# Patient Record
Sex: Male | Born: 1945 | Race: White | Hispanic: No | Marital: Married | Smoking: Never smoker
Health system: Southern US, Community
[De-identification: ages and names within clinical notes are randomized; demographics above are authoritative.]

## PROBLEM LIST (undated history)

## (undated) ENCOUNTER — Emergency Department (HOSPITAL_COMMUNITY): Admission: EM | Payer: Medicare Other | Source: Home / Self Care

## (undated) DIAGNOSIS — I10 Essential (primary) hypertension: Secondary | ICD-10-CM

## (undated) DIAGNOSIS — Z87442 Personal history of urinary calculi: Secondary | ICD-10-CM

## (undated) DIAGNOSIS — G4730 Sleep apnea, unspecified: Secondary | ICD-10-CM

## (undated) DIAGNOSIS — K219 Gastro-esophageal reflux disease without esophagitis: Secondary | ICD-10-CM

## (undated) HISTORY — PX: EYE SURGERY: SHX253

## (undated) HISTORY — PX: CHOLECYSTECTOMY: SHX55

## (undated) HISTORY — PX: TONSILLECTOMY: SUR1361

## (undated) HISTORY — PX: APPENDECTOMY: SHX54

---

## 2020-01-03 ENCOUNTER — Ambulatory Visit
Admission: RE | Admit: 2020-01-03 | Discharge: 2020-01-03 | Disposition: A | Discharge status: Home / Self Care | Payer: Medicare Other | Source: Ambulatory Visit | Attending: Physician Assistant | Admitting: Physician Assistant

## 2020-01-03 ENCOUNTER — Other Ambulatory Visit: Payer: Self-pay | Admitting: Physician Assistant

## 2020-01-03 ENCOUNTER — Other Ambulatory Visit: Payer: Self-pay

## 2020-01-03 DIAGNOSIS — R634 Abnormal weight loss: Secondary | ICD-10-CM

## 2020-01-03 DIAGNOSIS — R112 Nausea with vomiting, unspecified: Secondary | ICD-10-CM

## 2020-01-03 DIAGNOSIS — R6881 Early satiety: Secondary | ICD-10-CM

## 2020-01-03 IMAGING — CT CT ABD-PELV W/ CM
1 of 3 series · 13 of 32 positions shown, 19 images · IV contrast (APPLIED)
Comparison: None.

CLINICAL DATA: Loss of appetite, 41 pound weight loss, vomiting

EXAM:
CT ABDOMEN AND PELVIS WITH CONTRAST
TECHNIQUE: Multidetector CT imaging of the abdomen and pelvis was performed
using the standard protocol following bolus administration of
intravenous contrast.
CONTRAST:  100mL [IM] IOPAMIDOL ([IM]) INJECTION 61%

[Series 2: abd/pelvis w/cm · axial · 0.97mm/px · z∈[-429,-4]mm · 13 of 101 slices shown, 19 images]
[im 8/101  soft-tissue]
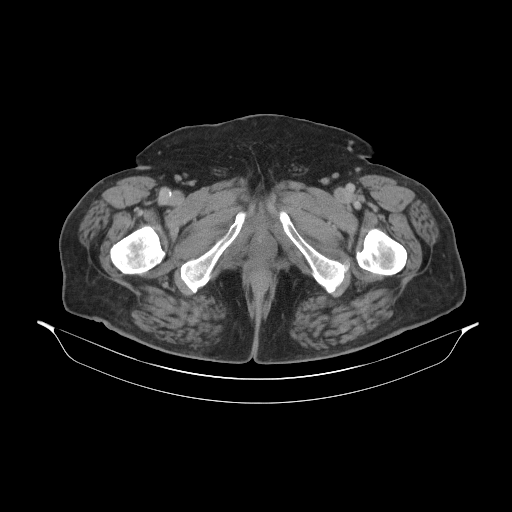
[im 8/101  bone]
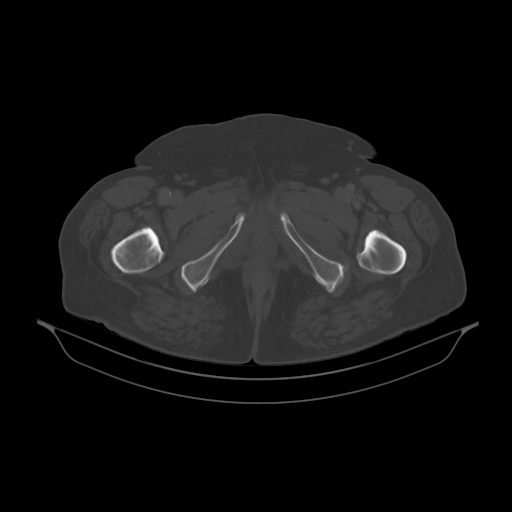
[im 15/101  soft-tissue]
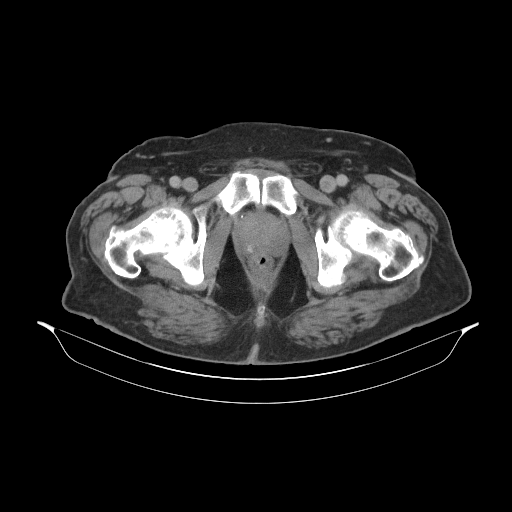
[im 22/101  soft-tissue]
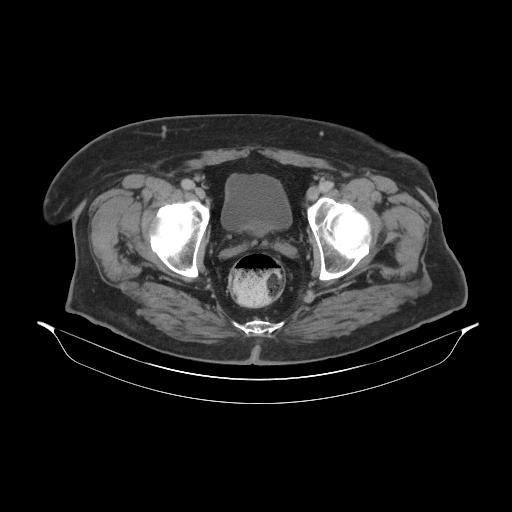
[im 29/101  soft-tissue]
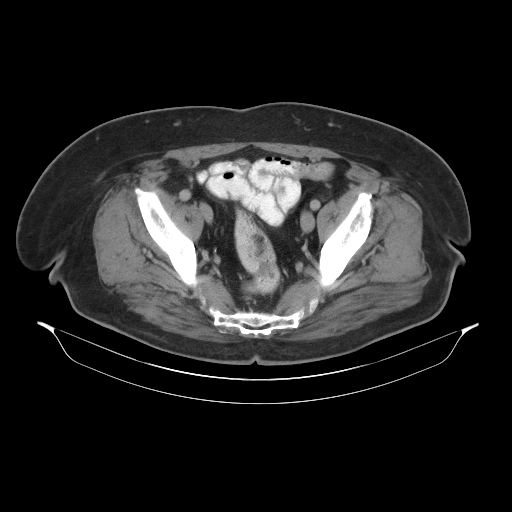
[im 36/101  soft-tissue]
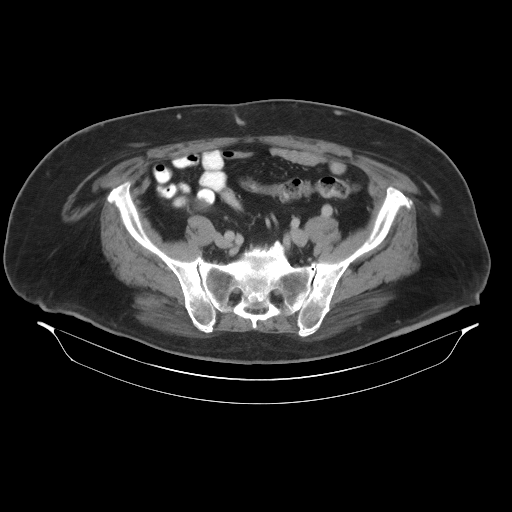
[im 43/101  soft-tissue]
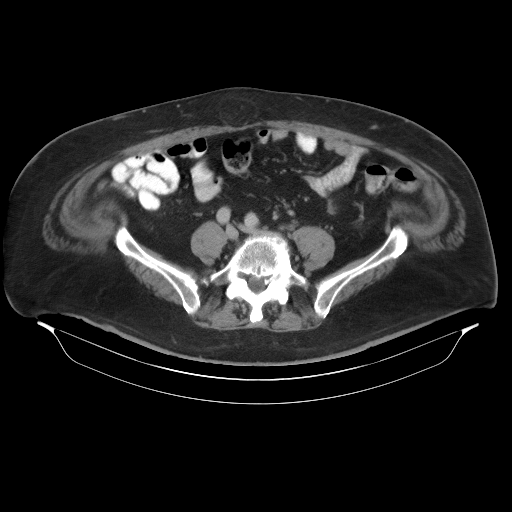
[im 51/101  soft-tissue]
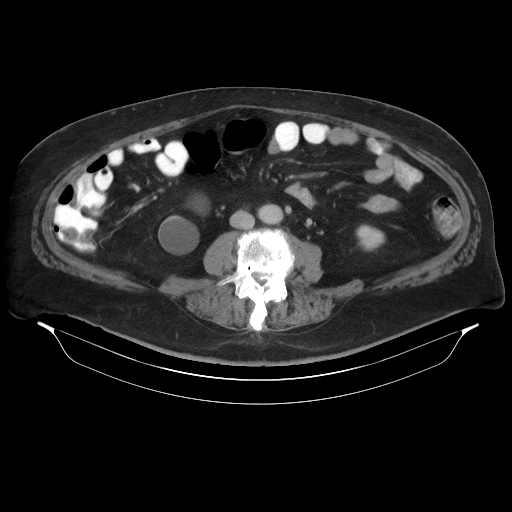
[im 58/101  soft-tissue]
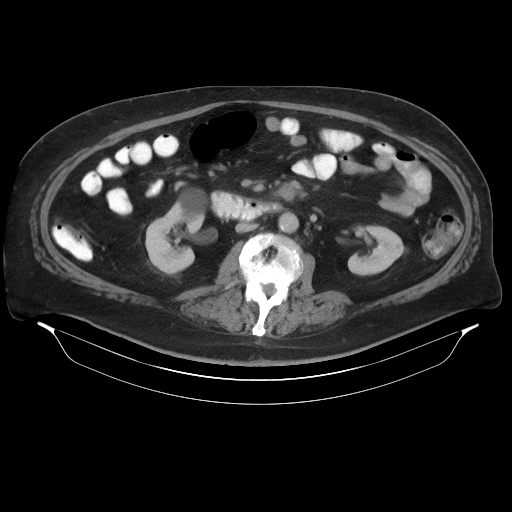
[im 65/101  soft-tissue]
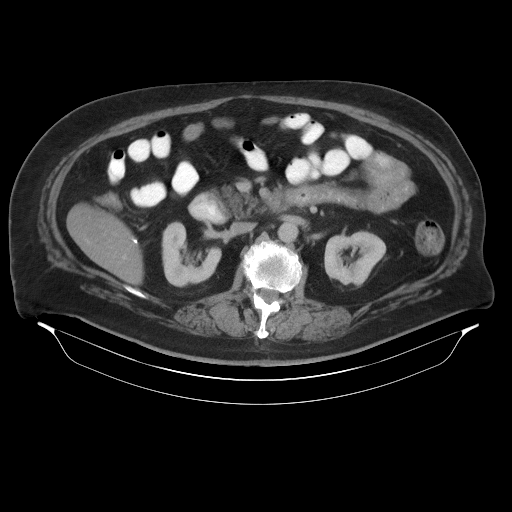
[im 65/101  bone]
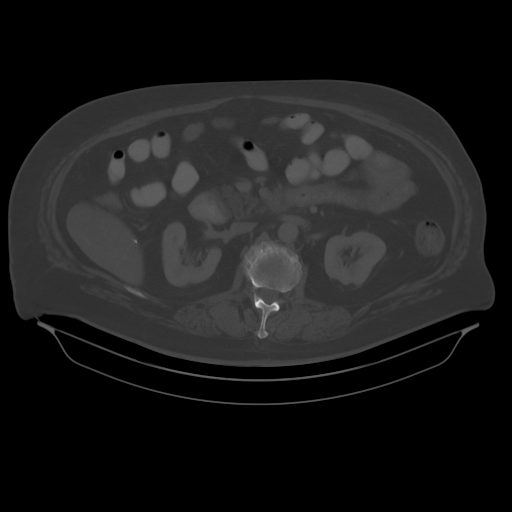
[im 72/101  soft-tissue]
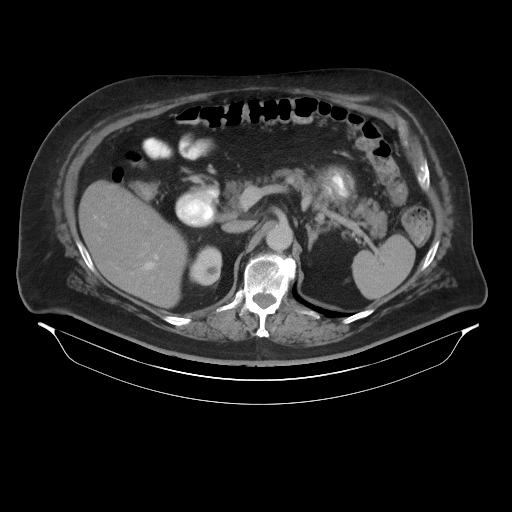
[im 72/101  lung]
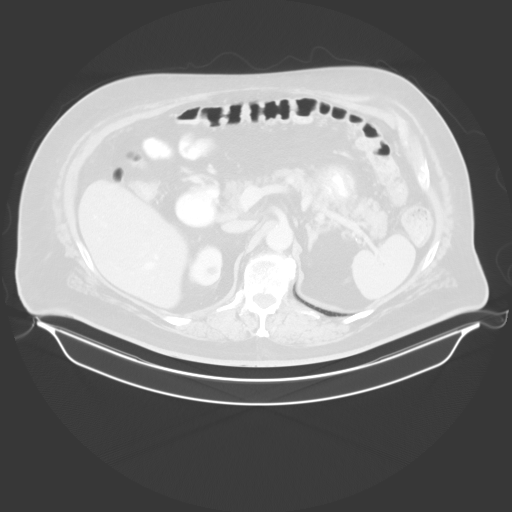
[im 79/101  soft-tissue]
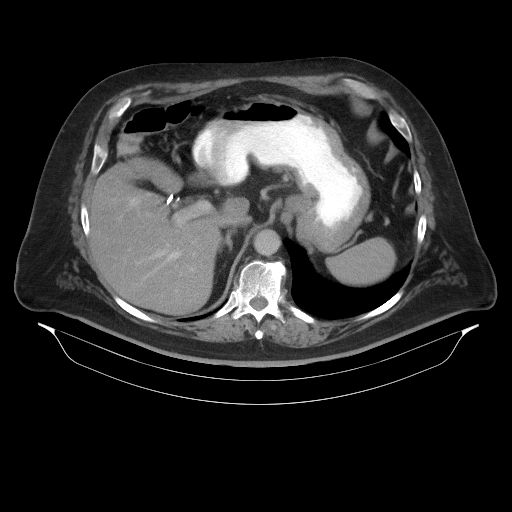
[im 79/101  lung]
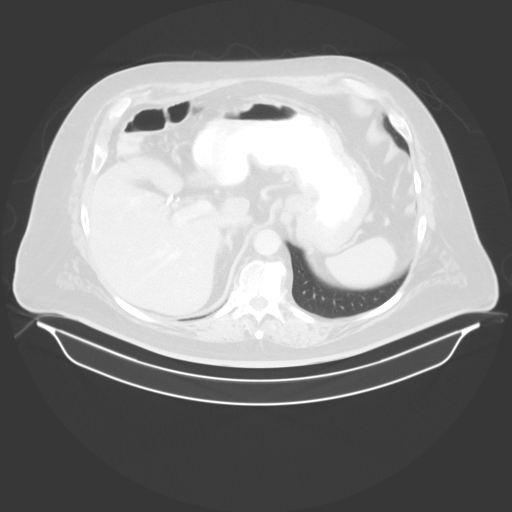
[im 86/101  soft-tissue]
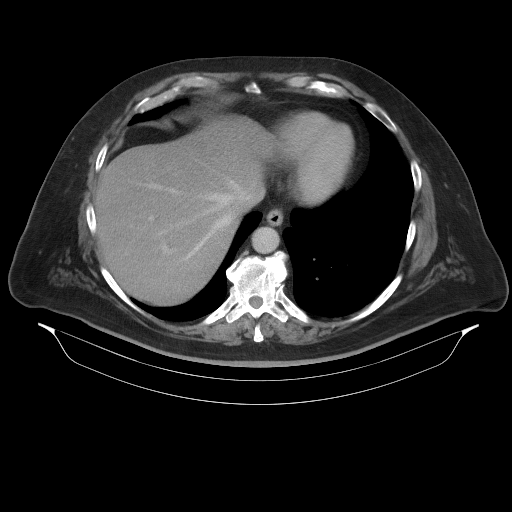
[im 86/101  lung]
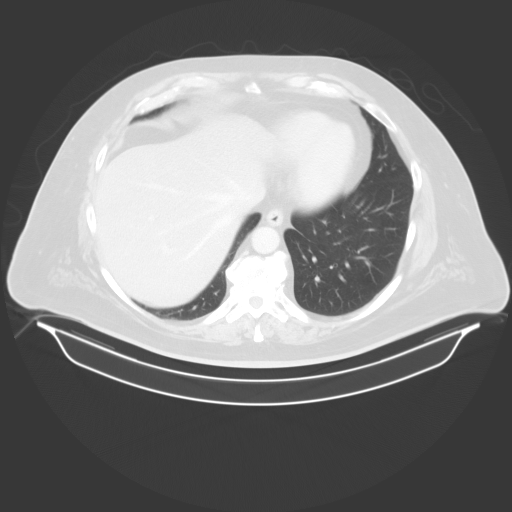
[im 93/101  soft-tissue]
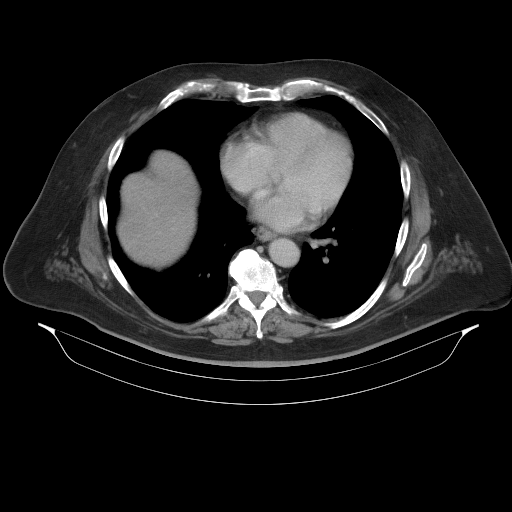
[im 93/101  lung]
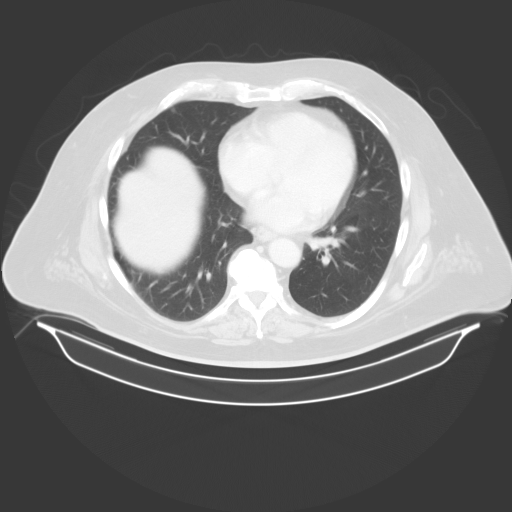

[13 of 32 positions shown; findings below may reference images not displayed]

FINDINGS: Lower chest: No acute pleural or parenchymal lung disease.

Hepatobiliary: Gallbladder is surgically absent. The liver is
grossly unremarkable.

Pancreas: Unremarkable. No pancreatic ductal dilatation or
surrounding inflammatory changes.

Spleen: Normal in size without focal abnormality.

Adrenals/Urinary Tract: There are bilateral renal cortical cysts. No
urinary tract calculi or obstructive uropathy. The bladder is
unremarkable. Adrenals are normal.

Stomach/Bowel: There is abnormal wall thickening of the distal
duodenum, with surrounding fat stranding and lymphadenopathy.
Underlying neoplasm is suspected given clinical history. Endoscopy
is recommended.

No bowel obstruction or ileus.  The appendix is not identified.

Vascular/Lymphatic: Enlarged lymph nodes are seen surrounding the
inflamed distal duodenum described above. Largest lymph node
measures 16 mm, reference image 45 of series 2.

Vascular structures enhance normally.

Reproductive: Prostate is enlarged measuring 5.9 x 4.7 cm.

Other: There is no free fluid or free gas. Small fat containing
supraumbilical hernia is noted.

Musculoskeletal: There are no acute or destructive bony lesions.
Reconstructed images demonstrate no additional findings.
IMPRESSION: 1. Wall thickening of the fourth portion duodenum just proximal to
the ligament of Treitz, with surrounding fat stranding and
pathologic mesenteric adenopathy. Given the clinical history,
neoplasm is the diagnosis of exclusion. Endoscopy is recommended for
further evaluation.
2. Enlarged prostate.
3. Fat containing supraumbilical ventral hernia.

## 2020-01-03 MED ORDER — IOPAMIDOL (ISOVUE-300) INJECTION 61%
100.0000 mL | Freq: Once | INTRAVENOUS | Status: AC | PRN
  Administered 2020-01-03: 100 mL via INTRAVENOUS

## 2020-01-04 ENCOUNTER — Other Ambulatory Visit (HOSPITAL_COMMUNITY): Payer: Self-pay | Admitting: Physician Assistant

## 2020-01-04 ENCOUNTER — Other Ambulatory Visit: Payer: Self-pay | Admitting: Physician Assistant

## 2020-01-04 DIAGNOSIS — R6881 Early satiety: Secondary | ICD-10-CM

## 2020-01-04 DIAGNOSIS — R634 Abnormal weight loss: Secondary | ICD-10-CM

## 2020-01-04 DIAGNOSIS — R1110 Vomiting, unspecified: Secondary | ICD-10-CM

## 2020-01-14 ENCOUNTER — Ambulatory Visit (HOSPITAL_COMMUNITY)
Admission: RE | Admit: 2020-01-14 | Discharge: 2020-01-14 | Disposition: A | Discharge status: Home / Self Care | Payer: Medicare Other | Source: Ambulatory Visit | Attending: Physician Assistant | Admitting: Physician Assistant

## 2020-01-14 ENCOUNTER — Other Ambulatory Visit: Payer: Self-pay

## 2020-01-14 DIAGNOSIS — R6881 Early satiety: Secondary | ICD-10-CM | POA: Diagnosis present

## 2020-01-14 DIAGNOSIS — R634 Abnormal weight loss: Secondary | ICD-10-CM | POA: Diagnosis present

## 2020-01-14 DIAGNOSIS — R1110 Vomiting, unspecified: Secondary | ICD-10-CM | POA: Diagnosis present

## 2020-01-14 LAB — POCT I-STAT CREATININE: Creatinine, Ser: 0.7 mg/dL (ref 0.61–1.24)

## 2020-01-14 IMAGING — MR MR ABDOMEN WO/W CM
15 of 17 series · 39 of 48 positions shown · IV contrast (gadavist)
Comparison: CT scan [DATE]

CLINICAL DATA: Weight loss, vomiting and abnormal recent CT scan.

EXAM:
MRI ABDOMEN WITHOUT AND WITH CONTRAST
TECHNIQUE: Multiplanar multisequence MR imaging of the abdomen was performed
both before and after the administration of intravenous contrast.
CONTRAST:  10mL GADAVIST GADOBUTROL 1 MMOL/ML IV SOLN

[Series 3: T2 · sagittal · 5.0mm · 1.03mm/px · 2 of 43 slices shown (1 of 5)]
[im 1/43]
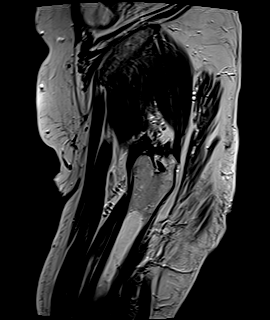
[im 43/43]
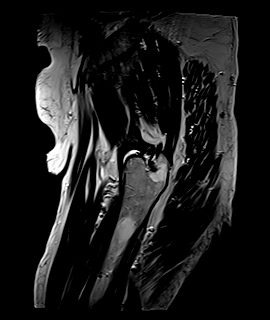

[Series 4: T2 · axial · 5.0mm · 0.53mm/px · z∈[-102,+126]mm · 2 of 39 slices shown (2 of 5)]
[im 1/39]
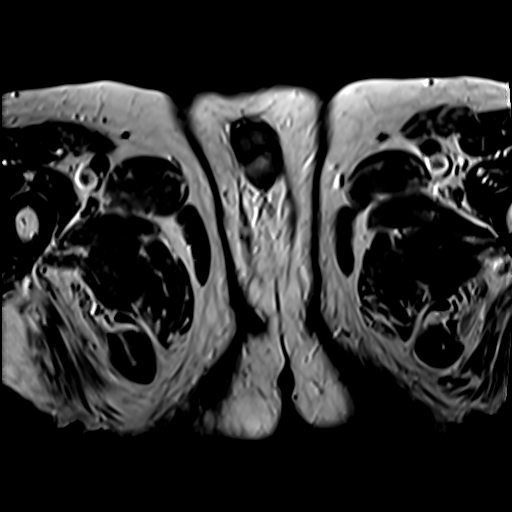
[im 39/39]
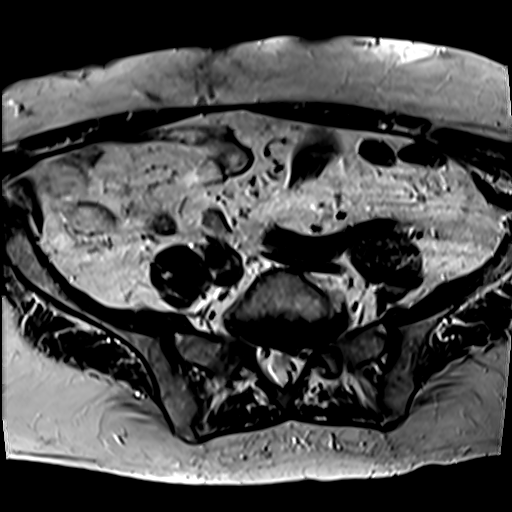

[Series 5: T1 · axial · 5.0mm · 0.53mm/px · z∈[-102,+126]mm · 2 of 39 slices shown (1 of 3)]
[im 1/39]
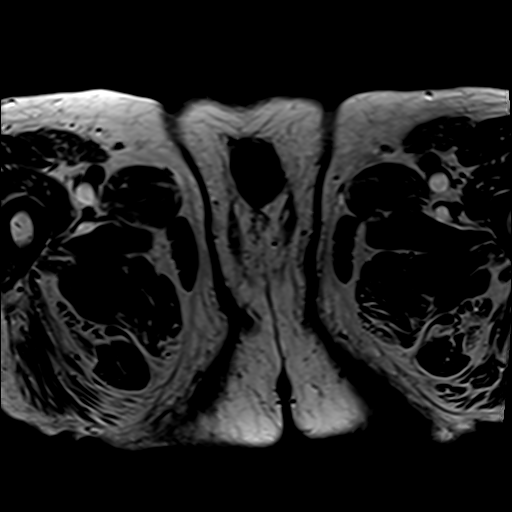
[im 39/39]
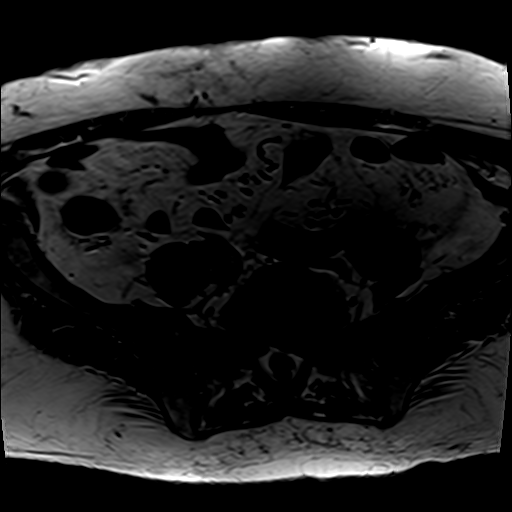

[Series 6: T1 fat-sat · axial · non-contrast · 5.0mm · 0.53mm/px · z∈[-102,+126]mm · 2 of 39 slices shown]
[im 1/39]
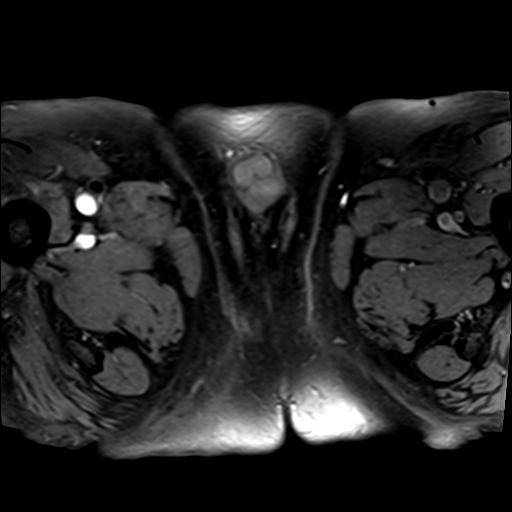
[im 39/39]
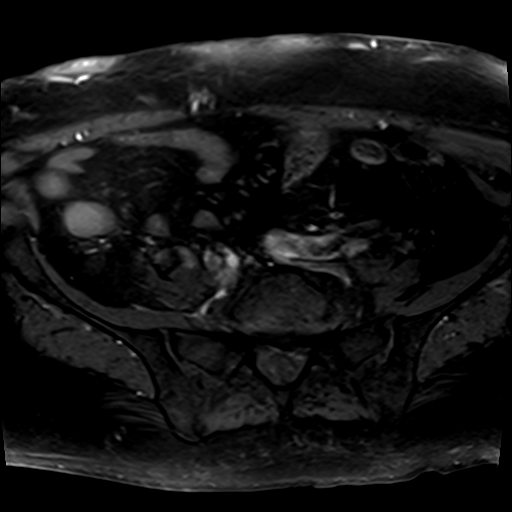

[Series 7: T2 · coronal · 5.0mm · 1.48mm/px · 2 of 32 slices shown (3 of 5)]
[im 1/32]
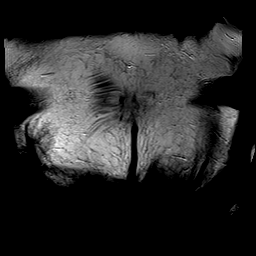
[im 32/32]
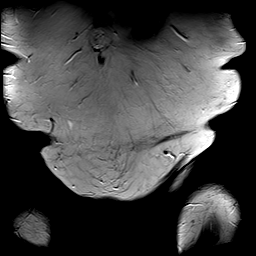

[Series 9: T2 · coronal · 6.0mm · 1.76mm/px · 2 of 36 slices shown (4 of 5)]
[im 1/36]
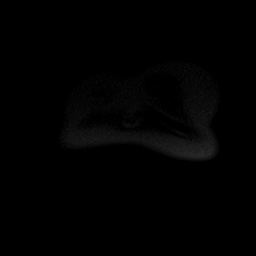
[im 36/36]
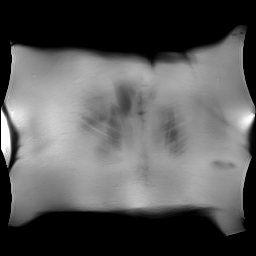

[Series 10: t2_trufi_tra_p2_bh_4mm · axial · 6.0mm · 0.88mm/px · z∈[+150,+444]mm · 2 of 50 slices shown]
[im 1/50]
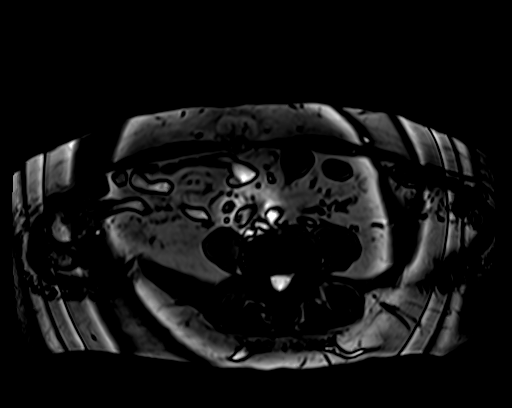
[im 50/50]
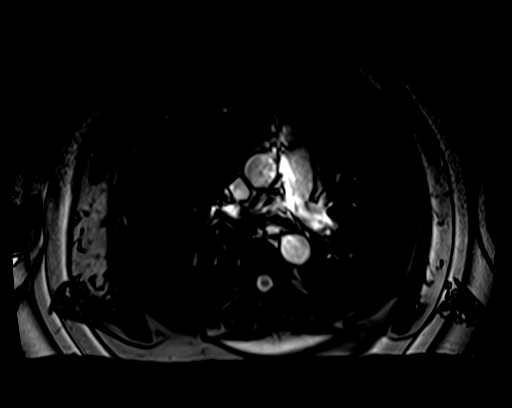

[Series 13: T1 · axial · 4.0mm · 1.34mm/px · z∈[+139,+455]mm · 4 of 80 slices shown (2 of 3)]
[im 1/80]
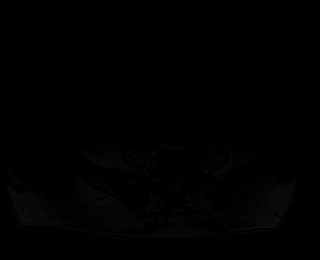
[im 27/80]
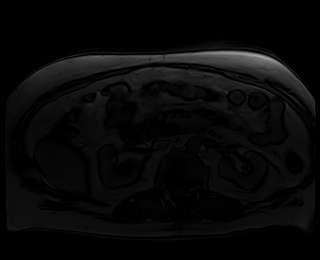
[im 53/80]
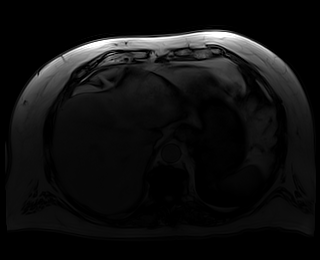
[im 80/80]
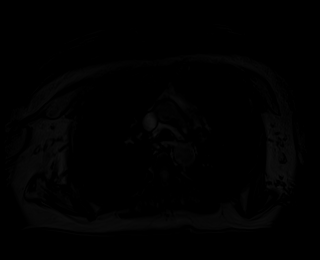

[Series 14: T1 · axial · 4.0mm · 1.34mm/px · z∈[+139,+455]mm · 4 of 80 slices shown (3 of 3)]
[im 1/80]
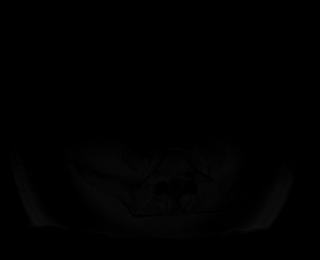
[im 27/80]
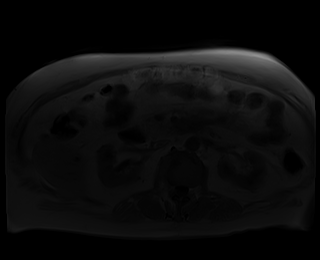
[im 53/80]
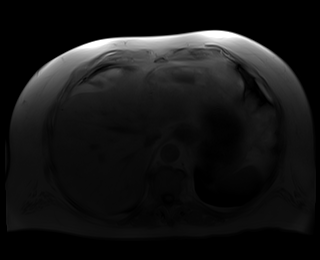
[im 80/80]
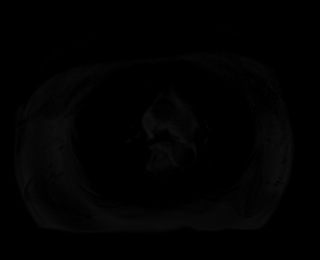

[Series 15: T2 fat-sat · axial · 6.0mm · 1.34mm/px · z∈[+143,+452]mm · 2 of 44 slices shown]
[im 1/44]
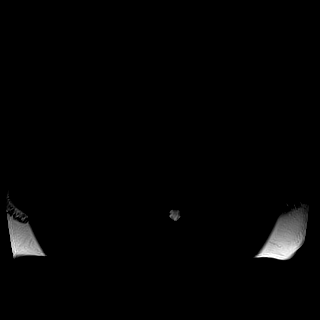
[im 44/44]
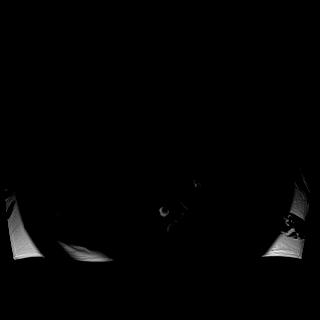

[Series 17: T2 · axial · 6.0mm · 1.95mm/px · z∈[+121,+474]mm · 2 of 50 slices shown (5 of 5)]
[im 1/50]
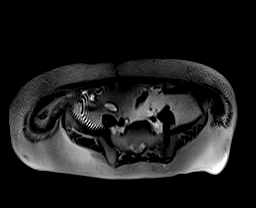
[im 50/50]
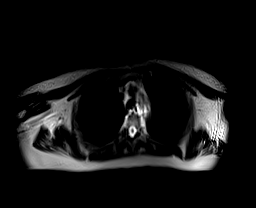

[Series 18: T1 dynamic · axial · 4.0mm · 1.41mm/px · z∈[+136,+452]mm · 4 of 80 slices shown (1 of 4)]
[im 1/80]
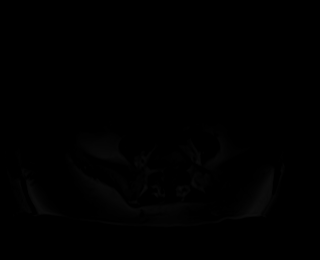
[im 27/80]
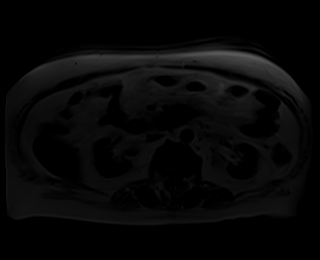
[im 53/80]
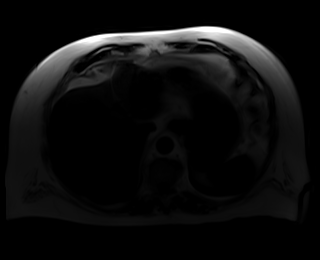
[im 80/80]
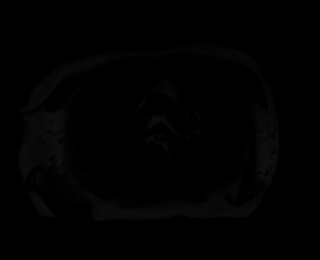

[Series 21: T1 dynamic · axial · 3.0mm · 1.25mm/px · z∈[-120,+117]mm · 4 of 80 slices shown (2 of 4)]
[im 1/80]
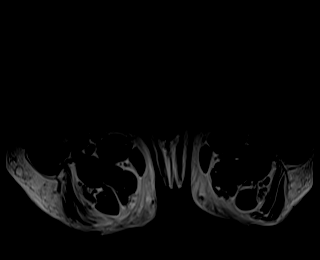
[im 27/80]
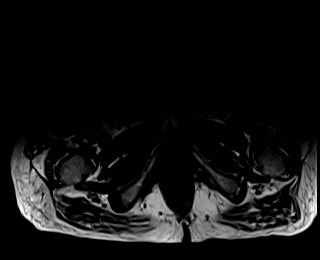
[im 53/80]
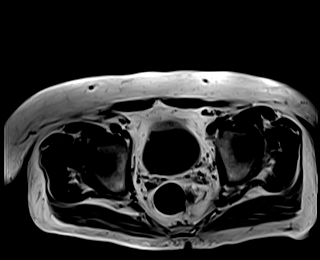
[im 80/80]
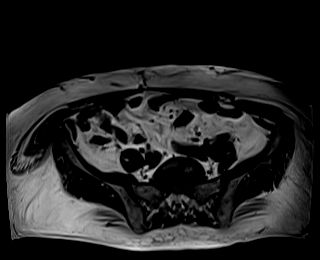

[Series 23: T1 dynamic · axial · 3.0mm · 1.25mm/px · z∈[-120,+117]mm · 4 of 80 slices shown (3 of 4)]
[im 1/80]
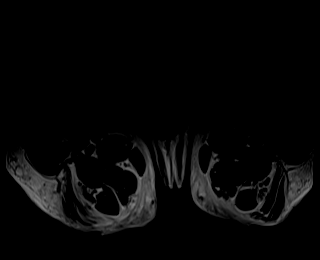
[im 27/80]
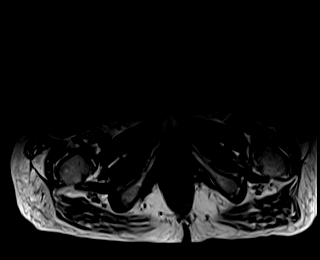
[im 53/80]
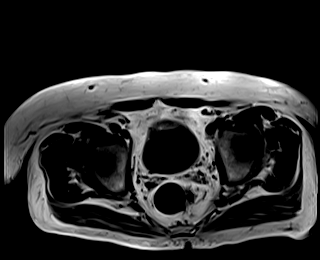
[im 80/80]
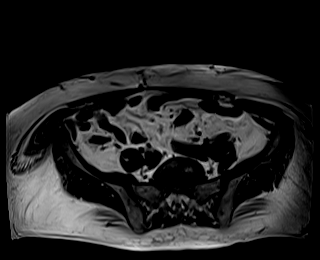

[Series 25: T1 dynamic · axial · 3.0mm · 1.25mm/px · 1 of 80 slices shown (4 of 4)]
[im 1/80]
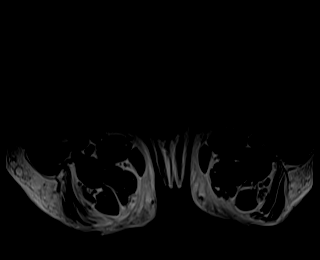

[39 of 48 positions shown; findings below may reference images not displayed]

FINDINGS: Lower chest: The lung bases are grossly clear. No pulmonary lesions.
No pleural or pericardial effusion.

Hepatobiliary: No focal hepatic lesions or intrahepatic biliary
dilatation. There is a small left hepatic lobe cyst noted. The
gallbladder is surgically absent. Normal caliber and course of the
common bile duct.

Pancreas:  No mass, inflammation or ductal dilatation.

Spleen:  Normal size. No focal lesions.

Adrenals/Urinary Tract: The adrenal glands and kidneys are
unremarkable. No worrisome renal lesions or hydronephrosis. Stable
lower pole renal cysts bilaterally.

Stomach/Bowel: The stomach is unremarkable. The descending duodenum
is normal. As demonstrated on the CT scan there is ill-defined wall
thickening involving the third portion the duodenum with some
surrounding ill-defined interstitial changes and soft tissue density
along with an enlarged adjacent mesenteric lymph node measuring up
to 19 mm (image 32/28. Findings certainly could be due to the
sequela of duodenitis but could not exclude neoplastic process such
as carcinoid tumor. Endoscopy may be helpful for further evaluation
and diagnostic purposes. Correlation with 24 hour urine 5 HIAA may
be useful. If clinically indicated a DOTATATE PET scan may be
helpful.

Vascular/Lymphatic: The aorta is normal in caliber. Branch vessels
are patent. The major venous structures are patent.

Other than the mesenteric lymph nodes near the third portion of the
duodenum I do not see any other areas of adenopathy.

Other:  No ascites or abdominal wall hernia.

Musculoskeletal: No significant bony findings. Scoliosis and
degenerative changes involving the spine.
IMPRESSION: 1. Ill-defined wall thickening involving the third portion of the
duodenum with surrounding ill-defined interstitial changes and soft
tissue density. There is also adjacent mesenteric adenopathy.
Findings could be due to the sequela of duodenitis but could not
exclude neoplastic process such as carcinoid tumor. Endoscopy may be
helpful for further evaluation and diagnostic purposes. Please see
above discussion.
2. Status post cholecystectomy.  No biliary dilatation.
3. Stable renal cysts.

## 2020-01-14 IMAGING — MR MR PELVIS WO/W CM
21 of 22 series · 47 of 48 positions shown · IV contrast (gadavist)
Comparison: CT scan [DATE]

CLINICAL DATA: Wall thickening noted fourth portion of duodenum on
recent CT scan.

EXAM:
MRI PELVIS WITHOUT AND WITH CONTRAST
TECHNIQUE: Multiplanar multisequence MR imaging of the abdomen and pelvis was
performed both before and after the administration of intravenous
contrast.
CONTRAST:  10mL GADAVIST GADOBUTROL 1 MMOL/ML IV SOLN

[Series 3: T2 · sagittal · 5.0mm · 1.03mm/px · 1 of 43 slices shown (1 of 5)]
[im 1/43]
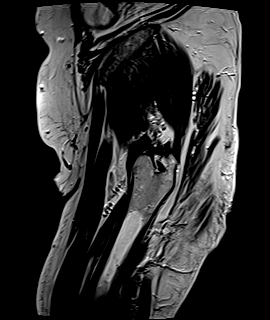

[Series 4: T2 · axial · 5.0mm · 0.53mm/px · 1 of 39 slices shown (2 of 5)]
[im 1/39]
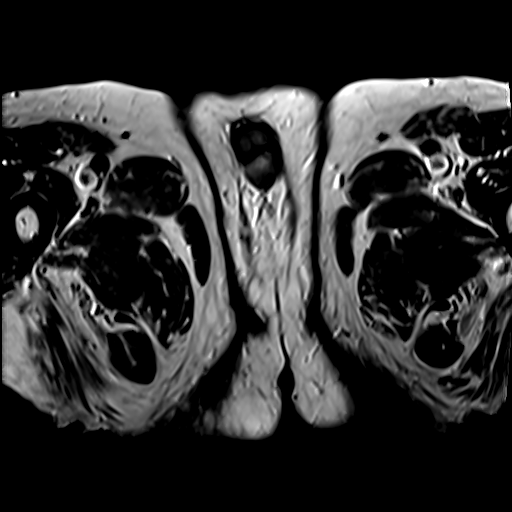

[Series 5: T1 · axial · 5.0mm · 0.53mm/px · 1 of 39 slices shown (1 of 3)]
[im 1/39]
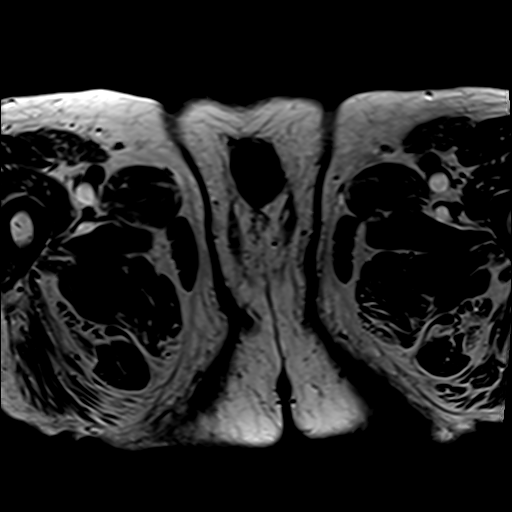

[Series 6: T1 fat-sat · axial · non-contrast · 5.0mm · 0.53mm/px · 1 of 39 slices shown]
[im 1/39]
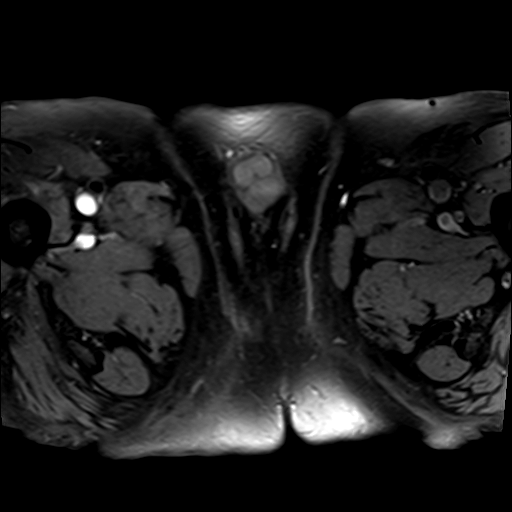

[Series 7: T2 · coronal · 5.0mm · 1.48mm/px · 1 of 32 slices shown (3 of 5)]
[im 1/32]
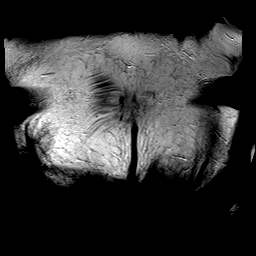

[Series 9: T2 · coronal · 6.0mm · 1.76mm/px · 1 of 36 slices shown (4 of 5)]
[im 1/36]
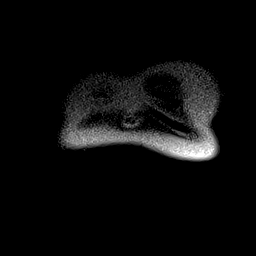

[Series 13: T1 · axial · 4.0mm · 1.34mm/px · z∈[+139,+455]mm · 3 of 80 slices shown (2 of 3)]
[im 1/80]
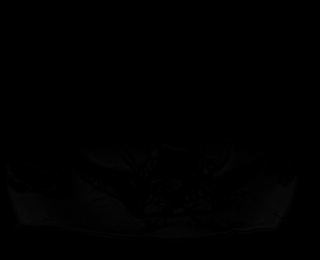
[im 40/80]
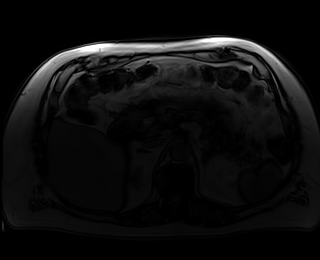
[im 80/80]
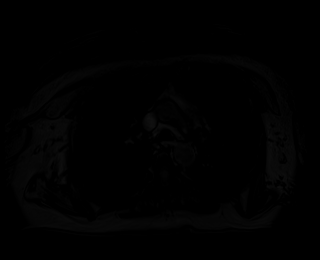

[Series 14: T1 · axial · 4.0mm · 1.34mm/px · z∈[+139,+455]mm · 3 of 80 slices shown (3 of 3)]
[im 1/80]
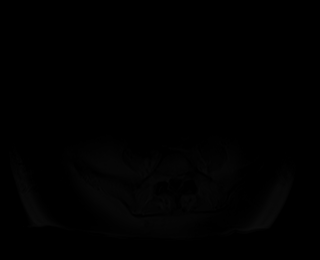
[im 40/80]
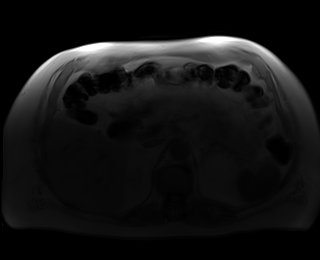
[im 80/80]
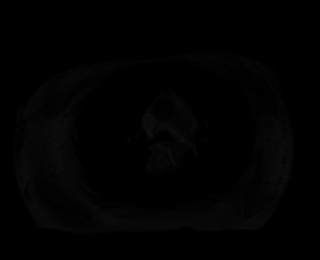

[Series 15: T2 fat-sat · axial · 6.0mm · 1.34mm/px · z∈[+143,+452]mm · 2 of 44 slices shown]
[im 1/44]
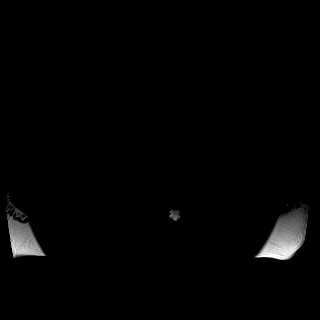
[im 44/44]
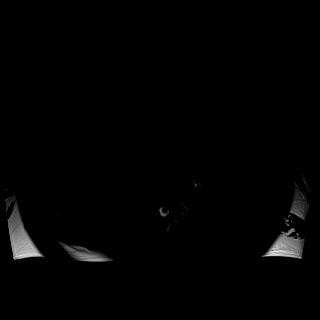

[Series 17: T2 · axial · 6.0mm · 1.95mm/px · z∈[+121,+474]mm · 2 of 50 slices shown (5 of 5)]
[im 1/50]
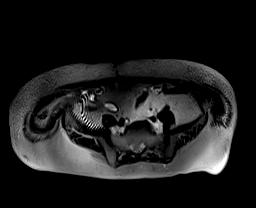
[im 50/50]
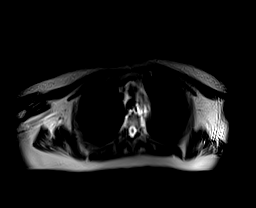

[Series 18: T1 dynamic · axial · 4.0mm · 1.41mm/px · z∈[+136,+452]mm · 3 of 80 slices shown (1 of 10)]
[im 1/80]
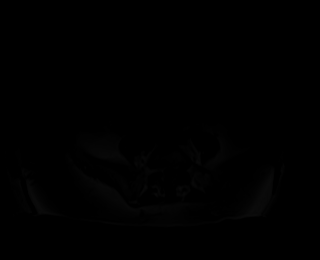
[im 40/80]
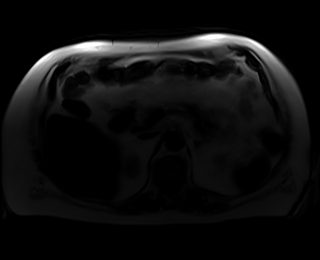
[im 80/80]
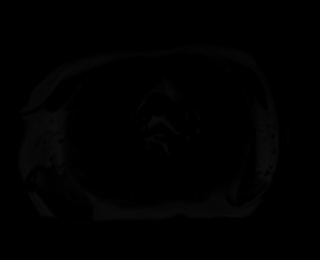

[Series 19: T1 dynamic · axial · 4.0mm · 1.41mm/px · z∈[+136,+452]mm · 3 of 80 slices shown (2 of 10)]
[im 1/80]
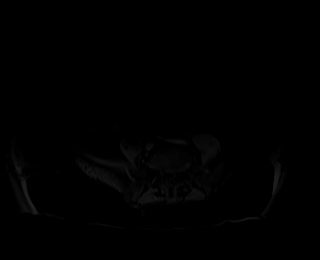
[im 40/80]
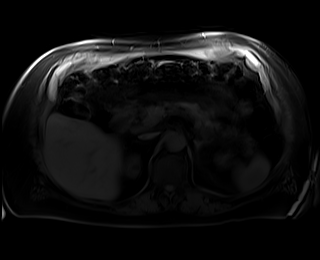
[im 80/80]
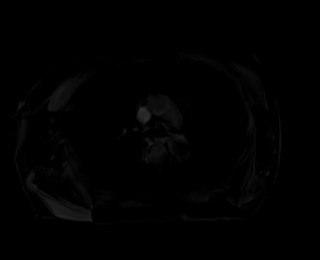

[Series 21: T1 dynamic · axial · 3.0mm · 1.25mm/px · z∈[-120,+117]mm · 3 of 80 slices shown (3 of 10)]
[im 1/80]
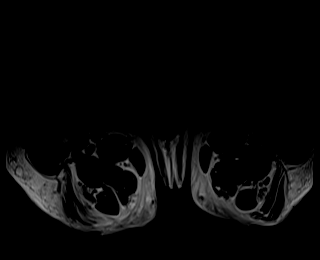
[im 40/80]
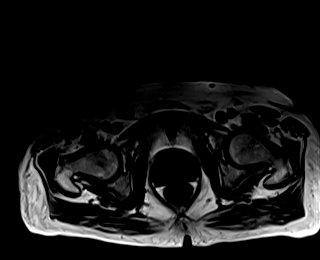
[im 80/80]
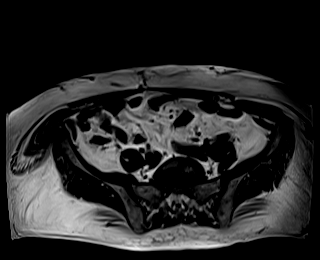

[Series 22: T1 dynamic · axial · 3.0mm · 1.25mm/px · z∈[-120,+117]mm · 3 of 80 slices shown (4 of 10)]
[im 1/80]
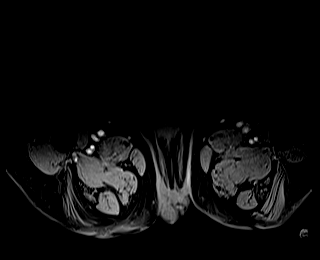
[im 40/80]
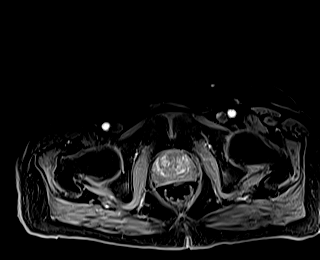
[im 80/80]
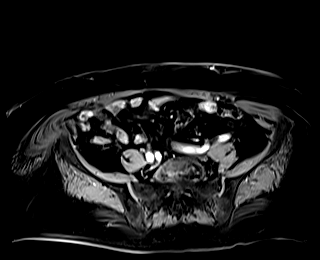

[Series 23: T1 dynamic · axial · 3.0mm · 1.25mm/px · z∈[-120,+117]mm · 3 of 80 slices shown (5 of 10)]
[im 1/80]
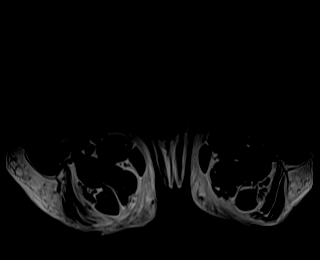
[im 40/80]
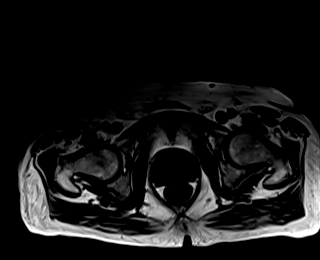
[im 80/80]
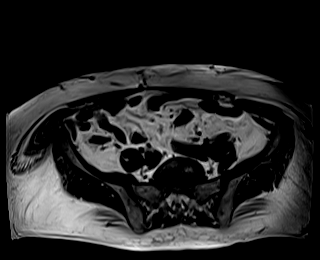

[Series 24: T1 dynamic · axial · 3.0mm · 1.25mm/px · z∈[-120,+117]mm · 3 of 80 slices shown (6 of 10)]
[im 1/80]
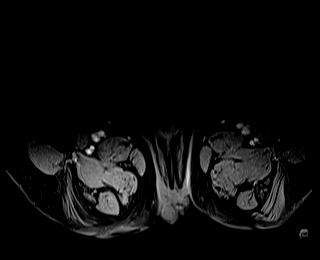
[im 40/80]
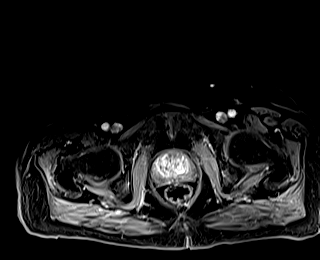
[im 80/80]
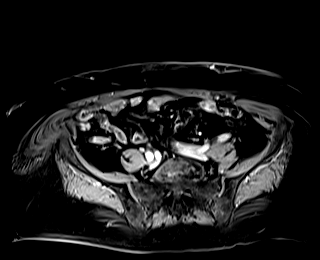

[Series 25: T1 dynamic · axial · 3.0mm · 1.25mm/px · z∈[-120,+117]mm · 3 of 80 slices shown (7 of 10)]
[im 1/80]
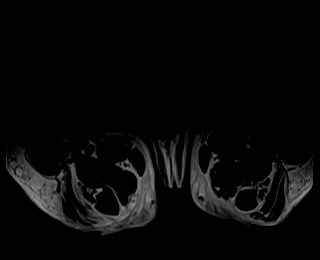
[im 40/80]
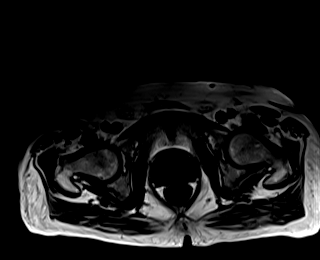
[im 80/80]
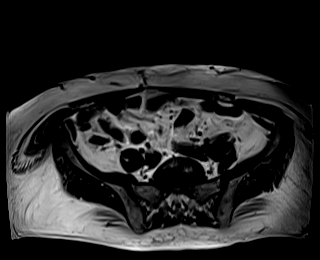

[Series 26: T1 dynamic · axial · 3.0mm · 1.25mm/px · z∈[-120,+117]mm · 3 of 80 slices shown (8 of 10)]
[im 1/80]
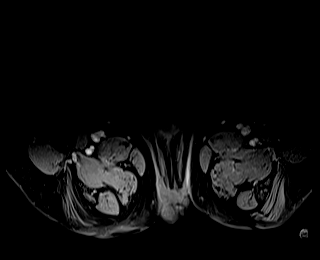
[im 40/80]
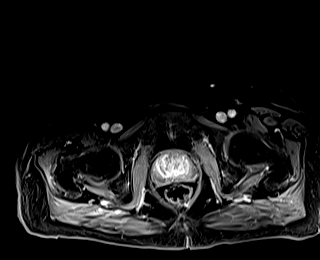
[im 80/80]
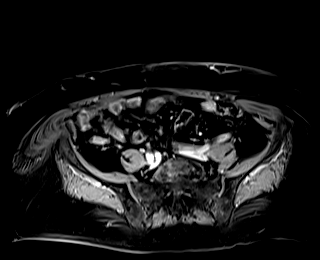

[Series 27: T1 dynamic · axial · 4.0mm · 1.41mm/px · z∈[+136,+452]mm · 3 of 80 slices shown (9 of 10)]
[im 1/80]
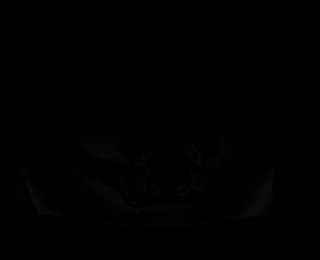
[im 40/80]
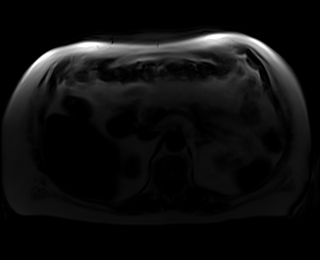
[im 80/80]
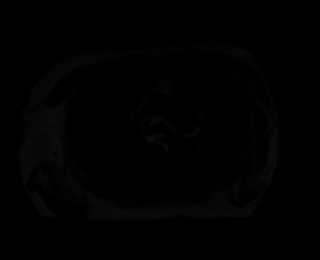

[Series 28: T1 dynamic · axial · 4.0mm · 1.41mm/px · z∈[+136,+452]mm · 3 of 80 slices shown (10 of 10)]
[im 1/80]
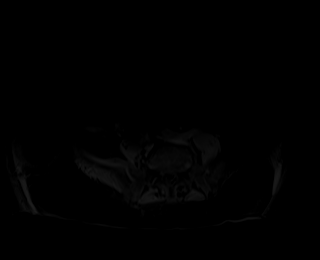
[im 40/80]
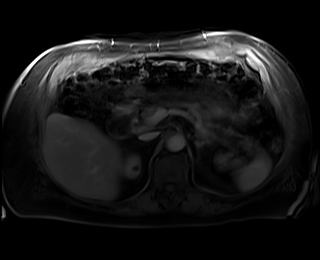
[im 80/80]
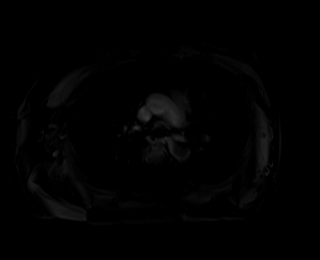

[Series 29: T1 fat-sat post-contrast · axial · 5.0mm · 0.53mm/px · 1 of 39 slices shown]
[im 1/39]
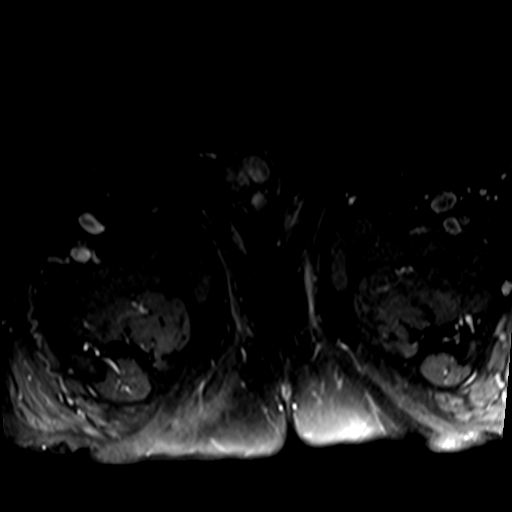

[47 of 48 positions shown; findings below may reference images not displayed]

FINDINGS: COMBINED FINDINGS FOR BOTH MR ABDOMEN AND PELVIS

Adrenals/Urinary Tract:  Distal ureters and bladder unremarkable.

Stomach/Bowel: Pelvic segments of small bowel and colon are
nondilated.

Vascular/Lymphatic:  No pelvic sidewall lymphadenopathy.

Reproductive: Prostate gland is enlarged.

Other:  No intraperitoneal free fluid.

Musculoskeletal: No abnormal marrow enhancement within the
visualized bony anatomy.
IMPRESSION: 1. No evidence for metastatic disease in the pelvis.
2. Prostatomegaly.

## 2020-01-14 MED ORDER — GADOBUTROL 1 MMOL/ML IV SOLN
10.0000 mL | Freq: Once | INTRAVENOUS | Status: AC | PRN
  Administered 2020-01-14: 10 mL via INTRAVENOUS

## 2020-01-16 IMAGING — MR MR ABDOMEN WO/W CM
18 of 19 series · 46 of 48 positions shown · IV contrast (10 GADAVIST)
Comparison: CT scan [DATE]

CLINICAL DATA: Weight loss, vomiting and abnormal recent CT scan.

EXAM:
MRI ABDOMEN WITHOUT AND WITH CONTRAST
TECHNIQUE: Multiplanar multisequence MR imaging of the abdomen was performed
both before and after the administration of intravenous contrast.
CONTRAST:  10mL GADAVIST GADOBUTROL 1 MMOL/ML IV SOLN

[Series 3: T2 · coronal · 6.0mm · 1.80mm/px · 2 of 40 slices shown (1 of 2)]
[im 1/40]
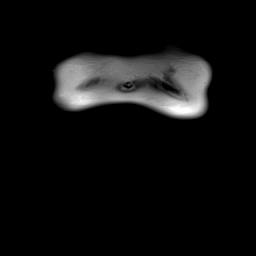
[im 40/40]
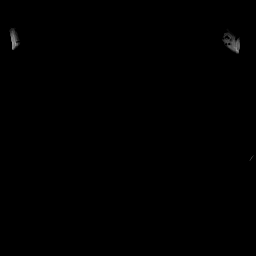

[Series 4: T2 fat-sat · axial · 6.0mm · 1.50mm/px · z∈[-116,+237]mm · 2 of 50 slices shown]
[im 1/50]
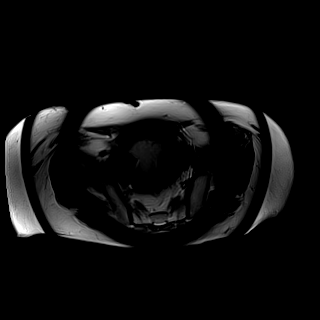
[im 50/50]
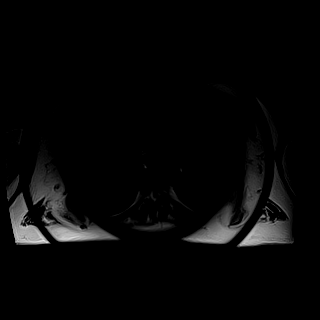

[Series 6: T1 · axial · 3.0mm · 1.44mm/px · z∈[-70,+215]mm · 3 of 96 slices shown (1 of 2)]
[im 1/96]
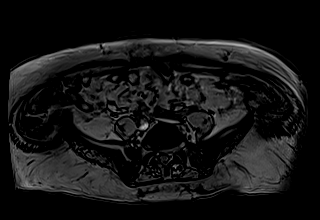
[im 48/96]
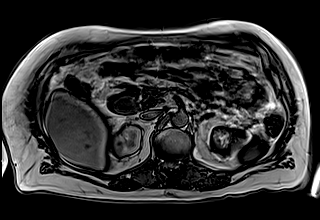
[im 96/96]
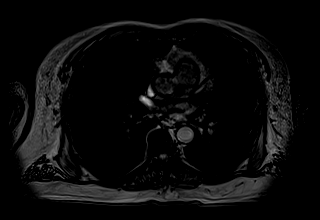

[Series 7: T1 · axial · 3.0mm · 1.44mm/px · z∈[-70,+215]mm · 3 of 96 slices shown (2 of 2)]
[im 1/96]
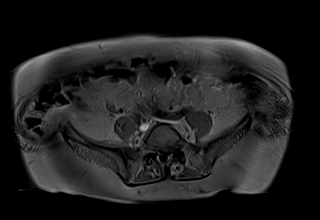
[im 48/96]
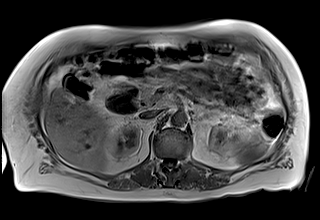
[im 96/96]
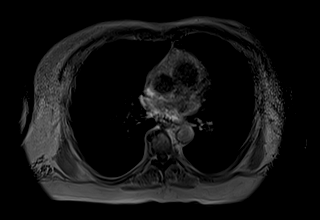

[Series 8: DWI · axial · 6.0mm · 1.79mm/px · z∈[-86,+223]mm · 3 of 88 slices shown (1 of 2)]
[im 1/88]
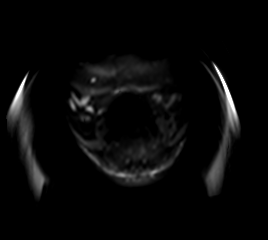
[im 44/88]
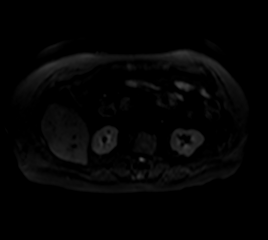
[im 88/88]
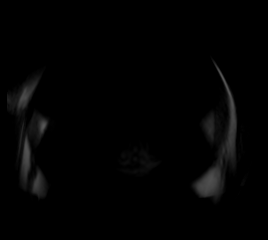

[Series 9: DWI · axial · 6.0mm · 1.79mm/px · 1 of 44 slices shown (2 of 2)]
[im 1/44]
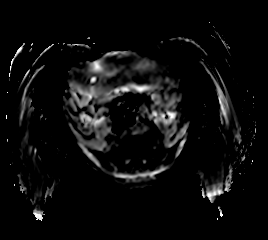

[Series 10: t2_trufi_tra_p2_bh_4mm · axial · 4.0mm · 0.94mm/px · z∈[-65,+227]mm · 2 of 74 slices shown]
[im 1/74]
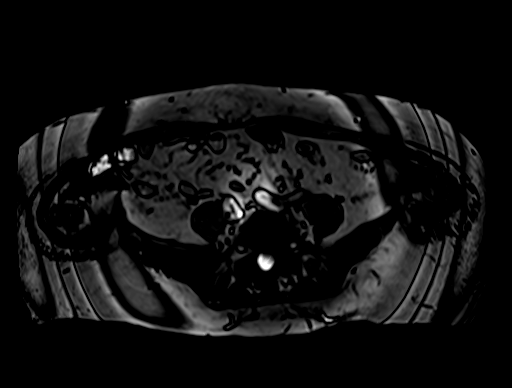
[im 74/74]
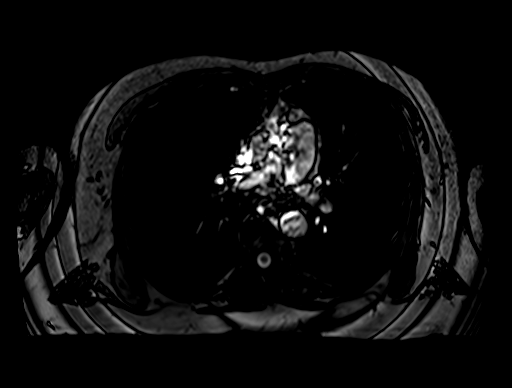

[Series 12: T1 dynamic · axial · 3.0mm · 1.56mm/px · z∈[-88,+221]mm · 3 of 104 slices shown (1 of 10)]
[im 1/104]
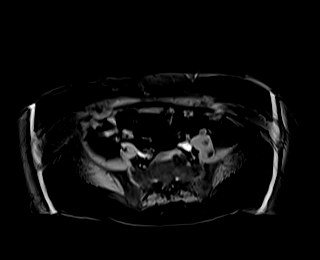
[im 52/104]
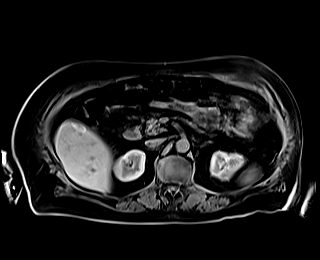
[im 104/104]
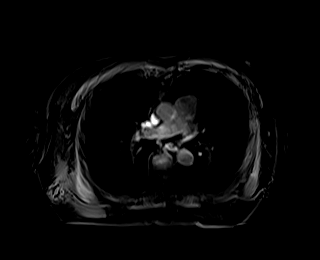

[Series 16: T1 dynamic · axial · 3.0mm · 1.56mm/px · z∈[-88,+221]mm · 3 of 104 slices shown (2 of 10)]
[im 1/104]
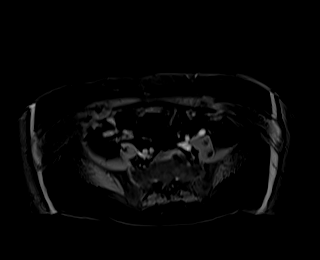
[im 52/104]
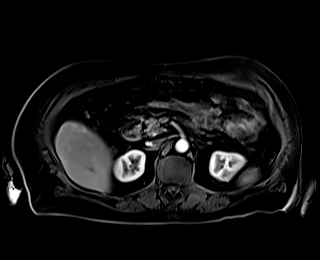
[im 104/104]
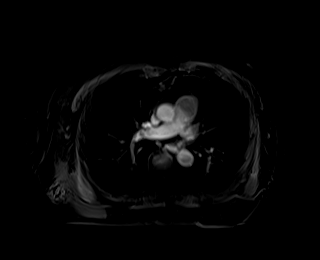

[Series 17: T1 dynamic · axial · 3.0mm · 1.56mm/px · z∈[-88,+221]mm · 3 of 104 slices shown (3 of 10)]
[im 1/104]
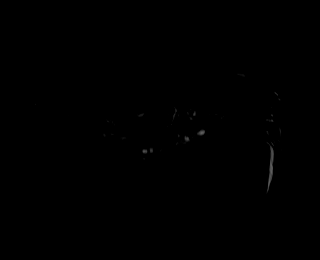
[im 52/104]
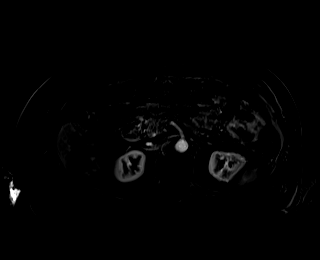
[im 104/104]
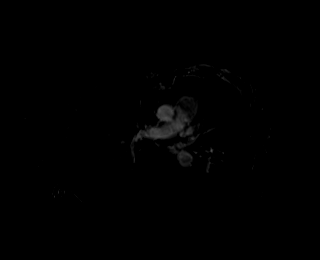

[Series 20: T1 dynamic · axial · 3.0mm · 1.56mm/px · z∈[-88,+221]mm · 3 of 104 slices shown (4 of 10)]
[im 1/104]
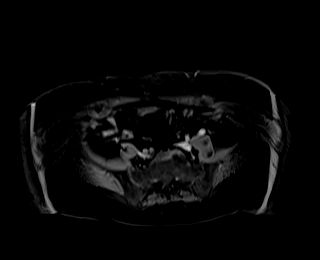
[im 52/104]
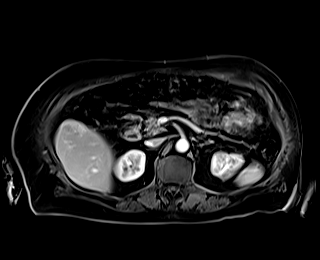
[im 104/104]
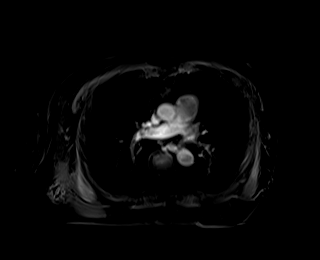

[Series 21: T1 dynamic · axial · 3.0mm · 1.56mm/px · z∈[-88,+221]mm · 3 of 104 slices shown (5 of 10)]
[im 1/104]
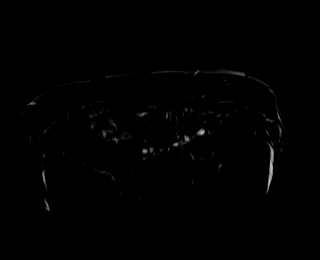
[im 52/104]
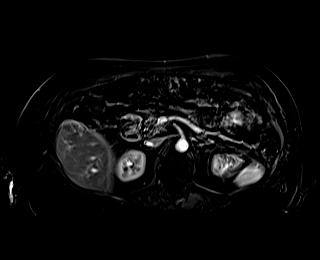
[im 104/104]
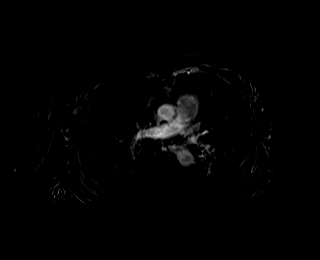

[Series 24: T1 dynamic · axial · 3.0mm · 1.56mm/px · z∈[-88,+221]mm · 3 of 104 slices shown (6 of 10)]
[im 1/104]
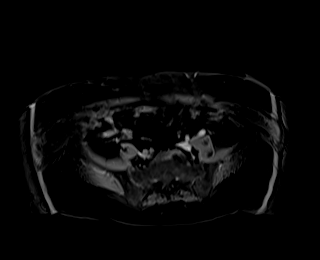
[im 52/104]
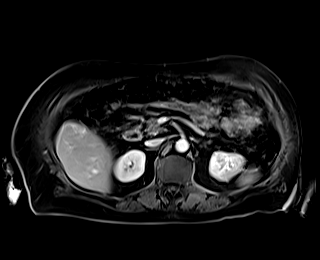
[im 104/104]
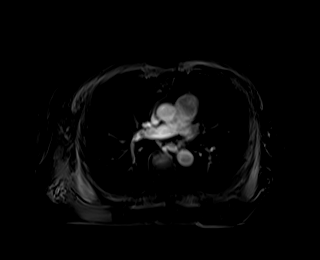

[Series 25: T1 dynamic · axial · 3.0mm · 1.56mm/px · z∈[-88,+221]mm · 3 of 104 slices shown (7 of 10)]
[im 1/104]
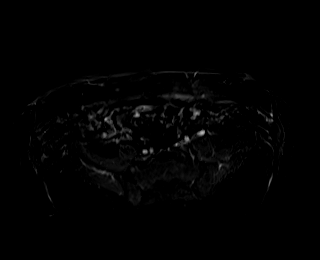
[im 52/104]
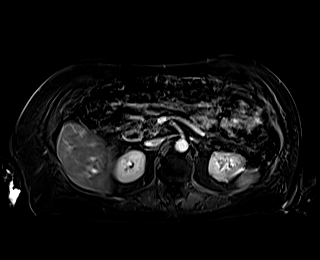
[im 104/104]
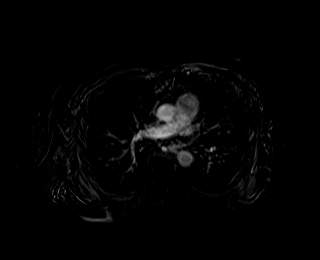

[Series 27: T1 dynamic · coronal · 5.0mm · 1.56mm/px · 2 of 60 slices shown (8 of 10)]
[im 1/60]
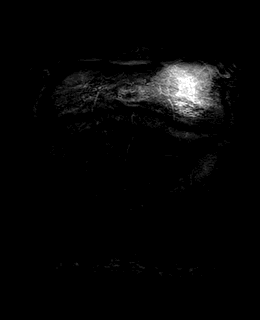
[im 60/60]
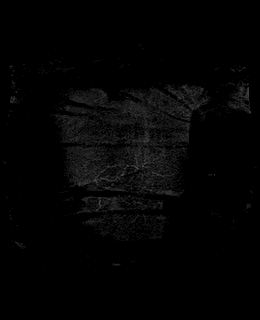

[Series 28: T2 · axial · 6.0mm · 1.88mm/px · 1 of 44 slices shown (2 of 2)]
[im 1/44]
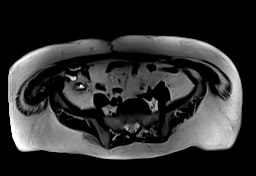

[Series 31: T1 dynamic · axial · 3.0mm · 1.56mm/px · z∈[-88,+221]mm · 3 of 104 slices shown (9 of 10)]
[im 1/104]
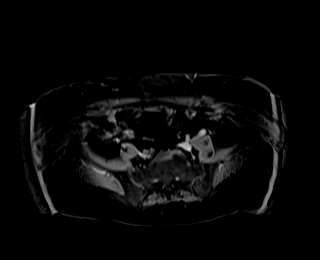
[im 52/104]
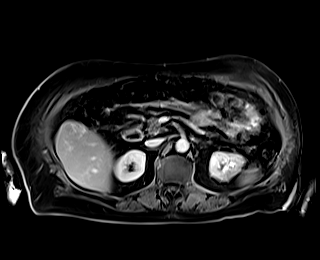
[im 104/104]
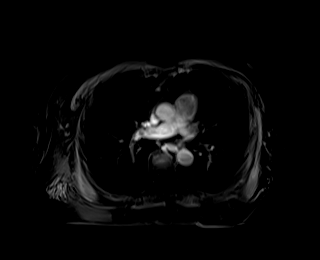

[Series 32: T1 dynamic · axial · 3.0mm · 1.56mm/px · z∈[-88,+221]mm · 3 of 104 slices shown (10 of 10)]
[im 1/104]
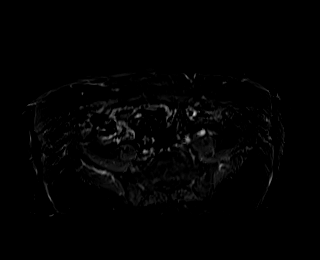
[im 52/104]
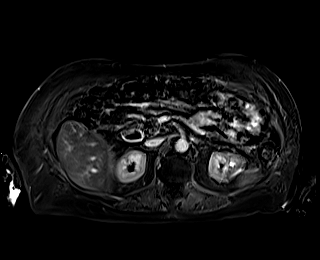
[im 104/104]
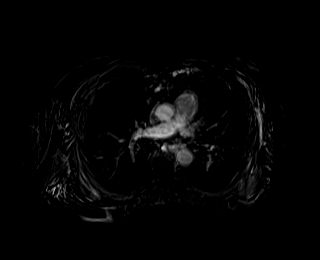

[46 of 48 positions shown; findings below may reference images not displayed]

FINDINGS: Lower chest: The lung bases are grossly clear. No pulmonary lesions.
No pleural or pericardial effusion.

Hepatobiliary: No focal hepatic lesions or intrahepatic biliary
dilatation. There is a small left hepatic lobe cyst noted. The
gallbladder is surgically absent. Normal caliber and course of the
common bile duct.

Pancreas:  No mass, inflammation or ductal dilatation.

Spleen:  Normal size. No focal lesions.

Adrenals/Urinary Tract: The adrenal glands and kidneys are
unremarkable. No worrisome renal lesions or hydronephrosis. Stable
lower pole renal cysts bilaterally.

Stomach/Bowel: The stomach is unremarkable. The descending duodenum
is normal. As demonstrated on the CT scan there is ill-defined wall
thickening involving the third portion the duodenum with some
surrounding ill-defined interstitial changes and soft tissue density
along with an enlarged adjacent mesenteric lymph node measuring up
to 19 mm (image 32/28. Findings certainly could be due to the
sequela of duodenitis but could not exclude neoplastic process such
as carcinoid tumor. Endoscopy may be helpful for further evaluation
and diagnostic purposes. Correlation with 24 hour urine 5 HIAA may
be useful. If clinically indicated a DOTATATE PET scan may be
helpful.

Vascular/Lymphatic: The aorta is normal in caliber. Branch vessels
are patent. The major venous structures are patent.

Other than the mesenteric lymph nodes near the third portion of the
duodenum I do not see any other areas of adenopathy.

Other:  No ascites or abdominal wall hernia.

Musculoskeletal: No significant bony findings. Scoliosis and
degenerative changes involving the spine.
IMPRESSION: 1. Ill-defined wall thickening involving the third portion of the
duodenum with surrounding ill-defined interstitial changes and soft
tissue density. There is also adjacent mesenteric adenopathy.
Findings could be due to the sequela of duodenitis but could not
exclude neoplastic process such as carcinoid tumor. Endoscopy may be
helpful for further evaluation and diagnostic purposes. Please see
above discussion.
2. Status post cholecystectomy.  No biliary dilatation.
3. Stable renal cysts.

## 2020-01-17 ENCOUNTER — Other Ambulatory Visit: Payer: Self-pay | Admitting: Gastroenterology

## 2020-01-21 ENCOUNTER — Other Ambulatory Visit (HOSPITAL_COMMUNITY)
Admission: RE | Admit: 2020-01-21 | Discharge: 2020-01-21 | Disposition: A | Discharge status: Home / Self Care | Payer: Medicare Other | Source: Ambulatory Visit | Attending: Gastroenterology | Admitting: Gastroenterology

## 2020-01-21 DIAGNOSIS — Z01812 Encounter for preprocedural laboratory examination: Secondary | ICD-10-CM | POA: Insufficient documentation

## 2020-01-21 DIAGNOSIS — Z20822 Contact with and (suspected) exposure to covid-19: Secondary | ICD-10-CM | POA: Insufficient documentation

## 2020-01-21 LAB — SARS CORONAVIRUS 2 (TAT 6-24 HRS): SARS Coronavirus 2: NEGATIVE

## 2020-01-24 ENCOUNTER — Other Ambulatory Visit: Payer: Self-pay

## 2020-01-24 ENCOUNTER — Encounter (HOSPITAL_COMMUNITY)
Admission: RE | Disposition: A | Discharge status: Home / Self Care | Payer: Self-pay | Source: Home / Self Care | Attending: Gastroenterology

## 2020-01-24 ENCOUNTER — Ambulatory Visit (HOSPITAL_COMMUNITY): Payer: Medicare Other | Admitting: Registered Nurse

## 2020-01-24 ENCOUNTER — Other Ambulatory Visit: Payer: Medicare Other

## 2020-01-24 ENCOUNTER — Encounter (HOSPITAL_COMMUNITY): Payer: Self-pay | Admitting: Gastroenterology

## 2020-01-24 ENCOUNTER — Ambulatory Visit (HOSPITAL_COMMUNITY)
Admission: RE | Admit: 2020-01-24 | Discharge: 2020-01-24 | Disposition: A | Discharge status: Home / Self Care | Payer: Medicare Other | Attending: Gastroenterology | Admitting: Gastroenterology

## 2020-01-24 DIAGNOSIS — K209 Esophagitis, unspecified without bleeding: Secondary | ICD-10-CM | POA: Insufficient documentation

## 2020-01-24 DIAGNOSIS — K2970 Gastritis, unspecified, without bleeding: Secondary | ICD-10-CM | POA: Diagnosis not present

## 2020-01-24 DIAGNOSIS — K3189 Other diseases of stomach and duodenum: Secondary | ICD-10-CM | POA: Insufficient documentation

## 2020-01-24 DIAGNOSIS — R933 Abnormal findings on diagnostic imaging of other parts of digestive tract: Secondary | ICD-10-CM | POA: Diagnosis present

## 2020-01-24 DIAGNOSIS — K315 Obstruction of duodenum: Secondary | ICD-10-CM | POA: Insufficient documentation

## 2020-01-24 HISTORY — DX: Sleep apnea, unspecified: G47.30

## 2020-01-24 HISTORY — PX: BIOPSY: SHX5522

## 2020-01-24 HISTORY — DX: Essential (primary) hypertension: I10

## 2020-01-24 HISTORY — PX: SUBMUCOSAL TATTOO INJECTION: SHX6856

## 2020-01-24 HISTORY — PX: ENTEROSCOPY: SHX5533

## 2020-01-24 SURGERY — ENTEROSCOPY
Anesthesia: Monitor Anesthesia Care

## 2020-01-24 MED ORDER — SPOT INK MARKER SYRINGE KIT
PACK | SUBMUCOSAL | Status: AC
  Filled 2020-01-24: qty 5

## 2020-01-24 MED ORDER — SODIUM CHLORIDE 0.9 % IV SOLN: INTRAVENOUS | Status: DC

## 2020-01-24 MED ORDER — GLYCOPYRROLATE 0.2 MG/ML IJ SOLN
INTRAMUSCULAR | Status: DC | PRN
  Administered 2020-01-24: .1 mg via INTRAVENOUS

## 2020-01-24 MED ORDER — LACTATED RINGERS IV SOLN
INTRAVENOUS | Status: DC
  Administered 2020-01-24: 1000 mL via INTRAVENOUS

## 2020-01-24 MED ORDER — PROPOFOL 500 MG/50ML IV EMUL
INTRAVENOUS | Status: DC | PRN
  Administered 2020-01-24: 200 ug/kg/min via INTRAVENOUS

## 2020-01-24 MED ORDER — LIDOCAINE HCL (CARDIAC) PF 50 MG/5ML IV SOSY
PREFILLED_SYRINGE | INTRAVENOUS | Status: DC | PRN
  Administered 2020-01-24: 75 mg via INTRAVENOUS

## 2020-01-24 NOTE — Anesthesia Procedure Notes (Signed)
Procedure Name: MAC Date/Time: 01/24/2020 1:46 PM Performed by: Lissa Morales, CRNA Pre-anesthesia Checklist: Patient identified, Emergency Drugs available, Suction available, Patient being monitored and Timeout performed Patient Re-evaluated:Patient Re-evaluated prior to induction Oxygen Delivery Method: Simple face mask Placement Confirmation: positive ETCO2

## 2020-01-24 NOTE — Anesthesia Postprocedure Evaluation (Signed)
Anesthesia Post Note  Patient: Albert Hartman  Procedure(s) Performed: ENTEROSCOPY WITH LONG ENTEROSCOPE (N/A ) SUBMUCOSAL TATTOO INJECTION BIOPSY     Patient location during evaluation: PACU Anesthesia Type: MAC Level of consciousness: awake and alert and oriented Pain management: pain level controlled Vital Signs Assessment: post-procedure vital signs reviewed and stable Respiratory status: spontaneous breathing, nonlabored ventilation and respiratory function stable Cardiovascular status: stable and blood pressure returned to baseline Postop Assessment: no apparent nausea or vomiting Anesthetic complications: no    Last Vitals:  Vitals:   01/24/20 1221 01/24/20 1441  BP: 120/74 103/62  Pulse: 82 89  Resp: 18 (!) 24  Temp: 37.1 C 36.6 C  SpO2: 99% 96%    Last Pain:  Vitals:   01/24/20 1441  TempSrc: Oral  PainSc: 0-No pain                 Rorik Vespa A.

## 2020-01-24 NOTE — Op Note (Signed)
[facility] Patient Name: Albert Hartman Procedure Date: 01/24/2020 MRN: [id number] Attending MD: Wonda Horner , MD Date of Birth: 12/20/1945 CSN: [id number] Age: 74 Admit Type: Outpatient Procedure:                Small bowel enteroscopy Indications:              Abnormal abdominal CT, Abnormal MRI of the abdomen Providers:                Wonda Horner, MD, Cleda Daub, RN, Cletis Athens,                            Technician Referring MD:              Medicines:                Propofol per Anesthesia Complications:            No immediate complications. Estimated Blood Loss:     Estimated blood loss was minimal. Procedure:                Pre-Anesthesia Assessment:                           - Prior to the procedure, a History and Physical                            was performed, and patient medications and                            allergies were reviewed. The patient's tolerance of                            previous anesthesia was also reviewed. The risks                            and benefits of the procedure and the sedation                            options and risks were discussed with the patient.                            All questions were answered, and informed consent                            was obtained. Prior Anticoagulants: The patient has                            taken no previous anticoagulant or antiplatelet                            agents. ASA Grade Assessment: II - A patient with                            mild systemic disease. After reviewing the risks  and benefits, the patient was deemed in                            satisfactory condition to undergo the procedure.                           After obtaining informed consent, the endoscope was                            passed under direct vision. Throughout the                            procedure, the patient's blood pressure, pulse, and         oxygen saturations were monitored continuously. The                            [id number]) Olympus enteroscope was                            introduced through the mouth and advanced to the                            fourth part of duodenum. The small bowel                            enteroscopy was accomplished without difficulty.                            The patient tolerated the procedure well. Scope In: Scope Out: Findings:      A stenosis was found in the fourth portion of the duodenum. The scope       would not pass beyond. There was a mass in this area.      A medium-sized mass with no bleeding was found in the fourth portion of       the duodenum. Biopsies were taken with a cold forceps for histology.       Area was tattooed with an injection of 3 mL of [place] ink.      Esophagitis was found. Previously seen and biopsied.      Inflammation was found in the stomach. Previously seen and biopsied. Impression:               - Duodenal stenosis.                           - Duodenal mass. Biopsied. Tattooed.                           - Esophagitis.                           - Gastritis. Recommendation:           - Advance diet as tolerated.                           - Await pathology results.                           -  Surgical referral will be made. Procedure Code(s):        --- Professional ---                           [id number], Small intestinal endoscopy, enteroscopy                            beyond second portion of duodenum, not including                            ileum; with biopsy, single or multiple                           44799, Unlisted procedure, small intestine Diagnosis Code(s):        --- Professional ---                           K31.5, Obstruction of duodenum                           K31.89, Other diseases of stomach and duodenum                           K20.90, Esophagitis, unspecified without bleeding                           K29.70, Gastritis,  unspecified, without bleeding                           R93.3, Abnormal findings on diagnostic imaging of                            other parts of digestive tract CPT copyright 2019 [employer]. All rights reserved. The codes documented in this report are preliminary and upon coder review may  be revised to meet current compliance requirements. Wonda Horner, MD 01/24/2020 2:46:36 PM This report has been signed electronically. Number of Addenda: 0

## 2020-01-24 NOTE — H&P (Signed)
The patient is a 74 year old male who presents to the endoscopy department today for EGD/enteroscopy.  He had a abnormal CT scan and MRI recently which showed abnormality in the distal portion of the duodenum.  Prior EGD which was done prior to the CT scan or MRI did not reveal any abnormality in the duodenal bulb or second portion of the duodenum however the scope was not advanced far enough to where the abnormality was seen on CT and MRI.  Therefore, repeat endoscopy and enteroscopy are being done.  No change in prior medical history.  Physical:  No distress  Heart regular rhythm  Lungs clear  Abdomen soft and nontender  Impression: Abnormal CT and MRI of distal duodenum  Plan: Proceed with enteroscopy

## 2020-01-24 NOTE — Transfer of Care (Signed)
Immediate Anesthesia Transfer of Care Note  Patient: Albert Hartman  Procedure(s) Performed: ENTEROSCOPY WITH LONG ENTEROSCOPE (N/A ) SUBMUCOSAL TATTOO INJECTION BIOPSY  Patient Location: PACU  Anesthesia Type:MAC  Level of Consciousness: awake, alert , oriented and patient cooperative  Airway & Oxygen Therapy: Patient Spontanous Breathing and Patient connected to face mask oxygen  Post-op Assessment: Report given to RN, Post -op Vital signs reviewed and stable and Patient moving all extremities X 4  Post vital signs: Reviewed and stable  Last Vitals:  Vitals Value Taken Time  BP 103/62 01/24/20 1441  Temp    Pulse 84 01/24/20 1443  Resp 20 01/24/20 1443  SpO2 100 % 01/24/20 1443  Vitals shown include unvalidated device data.  Last Pain:  Vitals:   01/24/20 1441  TempSrc:   PainSc: 0-No pain         Complications: No apparent anesthesia complications

## 2020-01-24 NOTE — Discharge Instructions (Signed)

## 2020-01-24 NOTE — Anesthesia Preprocedure Evaluation (Addendum)
Anesthesia Evaluation  Patient identified by MRN, date of birth, ID band Patient awake    Reviewed: Allergy & Precautions, NPO status , Patient's Chart, lab work & pertinent test results  Airway Mallampati: I       Dental no notable dental hx. (+) Teeth Intact   Pulmonary sleep apnea ,    breath sounds clear to auscultation       Cardiovascular hypertension, Pt. on medications Normal cardiovascular exam Rhythm:Regular Rate:Normal     Neuro/Psych    GI/Hepatic   Endo/Other    Renal/GU      Musculoskeletal   Abdominal Normal abdominal exam  (+)   Peds  Hematology   Anesthesia Other Findings   Reproductive/Obstetrics                            Anesthesia Physical Anesthesia Plan  ASA: II  Anesthesia Plan: MAC   Post-op Pain Management:    Induction:   PONV Risk Score and Plan: Propofol infusion  Airway Management Planned:   Additional Equipment: None  Intra-op Plan:   Post-operative Plan:   Informed Consent: I have reviewed the patients History and Physical, chart, labs and discussed the procedure including the risks, benefits and alternatives for the proposed anesthesia with the patient or authorized representative who has indicated his/her understanding and acceptance.       Plan Discussed with: CRNA  Anesthesia Plan Comments:         Anesthesia Quick Evaluation

## 2020-01-25 ENCOUNTER — Encounter: Payer: Self-pay | Admitting: *Deleted

## 2020-01-29 LAB — SURGICAL PATHOLOGY

## 2020-02-14 ENCOUNTER — Other Ambulatory Visit: Payer: Self-pay | Admitting: General Surgery

## 2020-03-12 ENCOUNTER — Encounter (HOSPITAL_COMMUNITY): Payer: Self-pay

## 2020-03-12 ENCOUNTER — Other Ambulatory Visit: Payer: Self-pay

## 2020-03-12 ENCOUNTER — Encounter (HOSPITAL_COMMUNITY)
Admission: RE | Admit: 2020-03-12 | Discharge: 2020-03-12 | Disposition: A | Discharge status: Home / Self Care | Payer: Medicare Other | Source: Ambulatory Visit | Attending: General Surgery | Admitting: General Surgery

## 2020-03-12 DIAGNOSIS — R5383 Other fatigue: Secondary | ICD-10-CM | POA: Insufficient documentation

## 2020-03-12 DIAGNOSIS — Z01812 Encounter for preprocedural laboratory examination: Secondary | ICD-10-CM | POA: Insufficient documentation

## 2020-03-12 DIAGNOSIS — G4733 Obstructive sleep apnea (adult) (pediatric): Secondary | ICD-10-CM | POA: Insufficient documentation

## 2020-03-12 DIAGNOSIS — M7989 Other specified soft tissue disorders: Secondary | ICD-10-CM | POA: Diagnosis not present

## 2020-03-12 HISTORY — DX: Gastro-esophageal reflux disease without esophagitis: K21.9

## 2020-03-12 HISTORY — DX: Personal history of urinary calculi: Z87.442

## 2020-03-12 LAB — COMPREHENSIVE METABOLIC PANEL
ALT: 25 U/L (ref 0–44)
AST: 35 U/L (ref 15–41)
Albumin: 2.6 g/dL — ABNORMAL LOW (ref 3.5–5.0)
Alkaline Phosphatase: 109 U/L (ref 38–126)
Anion gap: 10 (ref 5–15)
BUN: <5 mg/dL — ABNORMAL LOW (ref 8–23)
CO2: 28 mmol/L (ref 22–32)
Calcium: 8.7 mg/dL — ABNORMAL LOW (ref 8.9–10.3)
Chloride: 97 mmol/L — ABNORMAL LOW (ref 98–111)
Creatinine, Ser: 0.77 mg/dL (ref 0.61–1.24)
GFR calc Af Amer: >60 mL/min (ref >60)
GFR calc non Af Amer: >60 mL/min (ref >60)
Glucose, Bld: 99 mg/dL (ref 70–99)
Potassium: 4.2 mmol/L (ref 3.5–5.1)
Sodium: 135 mmol/L (ref 135–145)
Total Bilirubin: 0.8 mg/dL (ref 0.3–1.2)
Total Protein: 6 g/dL — ABNORMAL LOW (ref 6.5–8.1)

## 2020-03-12 LAB — CBC WITH DIFFERENTIAL/PLATELET
Abs Immature Granulocytes: 0.03 10*3/uL (ref 0.00–0.07)
Basophils Absolute: 0.1 10*3/uL (ref 0.0–0.1)
Basophils Relative: 1 %
Eosinophils Absolute: 0.1 10*3/uL (ref 0.0–0.5)
Eosinophils Relative: 2 %
HCT: 40.8 % (ref 39.0–52.0)
Hemoglobin: 13.1 g/dL (ref 13.0–17.0)
Immature Granulocytes: 0 %
Lymphocytes Relative: 33 %
Lymphs Abs: 2.6 10*3/uL (ref 0.7–4.0)
MCH: 29.6 pg (ref 26.0–34.0)
MCHC: 32.1 g/dL (ref 30.0–36.0)
MCV: 92.3 fL (ref 80.0–100.0)
Monocytes Absolute: 0.7 10*3/uL (ref 0.1–1.0)
Monocytes Relative: 9 %
Neutro Abs: 4.3 10*3/uL (ref 1.7–7.7)
Neutrophils Relative %: 55 %
Platelets: 296 10*3/uL (ref 150–400)
RBC: 4.42 MIL/uL (ref 4.22–5.81)
RDW: 17.3 % — ABNORMAL HIGH (ref 11.5–15.5)
WBC: 7.7 10*3/uL (ref 4.0–10.5)
nRBC: 0 % (ref 0.0–0.2)

## 2020-03-12 LAB — LIPASE, BLOOD: Lipase: 15 U/L (ref 11–51)

## 2020-03-12 LAB — PROTIME-INR
INR: 1 (ref 0.8–1.2)
Prothrombin Time: 13.4 seconds (ref 11.4–15.2)

## 2020-03-12 LAB — ABO/RH: ABO/RH(D): O POS

## 2020-03-12 LAB — PREPARE RBC (CROSSMATCH)

## 2020-03-12 NOTE — Progress Notes (Signed)
PCP - Jillyn Ledger Cardiologist - denies  Chest x-ray - n/a EKG - 03-12-20  SA - yes, wears CPAP  ERAS Protcol - yes, Ensure ordered and given   COVID TEST- Monday 03-17-20   Anesthesia review: yes Patient stated he had new onset swelling in left foot last night (03-12-19).  Albert Hartman notified, and came to speak with patient.  Albert Hartman to follow up.  Chart sent to anesthesia for review, per Albert Hartman.    Patient denies shortness of breath, fever, cough and chest pain at PAT appointment   All instructions explained to the patient, with a verbal understanding of the material. Patient agrees to go over the instructions while at home for a better understanding. Patient also instructed to self quarantine after being tested for COVID-19. The opportunity to ask questions was provided.

## 2020-03-12 NOTE — Progress Notes (Addendum)
[facility] [place], [address] [place] [address] Phone: [phone] Fax: [phone]      Your procedure is scheduled on Thursday, April 29th.  Report to [facility] Main Entrance "A" at 5:30 A.M., and check in at the Admitting office.  Call this number if you have problems the morning of surgery:  [phone]  Call [phone] if you have any questions prior to your surgery date Monday-Friday 8am-4pm    Remember:  Do not eat after midnight the night before your surgery  You may drink clear liquids until 4:30 AM the morning of your surgery.   Clear liquids allowed are: Water, Non-Citrus Juices (without pulp), Carbonated Beverages, Clear Tea, Black Coffee Only, and Gatorade   Enhanced Recovery after Surgery Enhanced Recovery after Surgery is a protocol used to improve the stress on your body and your recovery after surgery.  Patient Instructions  . The night before surgery:  o No food after midnight. ONLY clear liquids after midnight  .  Marland Kitchen The day of surgery (if you do NOT have diabetes):  o Drink TWO (2) Pre-Surgery Clear Ensures the night before surgery - Drink (1) at dinner, drink (1) at bedtime o Drink ONE (1) Ensure the morning of surgery, finish by 4:30 AM   o This drink was given to you during your hospital  pre-op appointment visit. o The pre-op nurse will instruct you on the time to drink the  Pre-Surgery Ensure depending on your surgery time. o Finish the drink at the designated time by the pre-op nurse.  o Nothing else to drink after completing the  Pre-Surgery Clear Ensure.          If you have questions, please contact your surgeon's office.     Take these medicines the morning of surgery with A SIP OF WATER   Allopurinol (Zyloprim)  Omeprazole (Prilosec)  Eye drops - if needed  As of today, STOP taking any Aspirin (unless otherwise instructed by your surgeon)  and Aspirin containing products, Aleve, Naproxen, Ibuprofen, Motrin, Advil, Goody's, BC's, all herbal medications, fish oil, and all vitamins.                      Do not wear jewelry            Do not wear lotions, powders, colognes, or deodorant.            Men may shave face and neck.            Do not bring valuables to the hospital.            [facility] is not responsible for any belongings or valuables.  Do NOT Smoke (Tobacco/Vapping) or drink Alcohol 24 hours prior to your procedure If you use a CPAP at night, you may bring all equipment for your overnight stay.   Contacts, glasses, dentures or bridgework may not be worn into surgery.      For patients admitted to the hospital, discharge time will be determined by your treatment team.   Patients discharged the day of surgery will not be allowed to drive home, and someone needs to stay with them for 24 hours.    Special instructions:   [place]- Preparing For Surgery  Before surgery, you can play an important role. Because skin is not sterile, your skin needs to be as free of germs as  possible. You can reduce the number of germs on your skin by washing with CHG (chlorahexidine gluconate) Soap before surgery.  CHG is an antiseptic cleaner which kills germs and bonds with the skin to continue killing germs even after washing.    Oral Hygiene is also important to reduce your risk of infection.  Remember - BRUSH YOUR TEETH THE MORNING OF SURGERY WITH YOUR REGULAR TOOTHPASTE  Please do not use if you have an allergy to CHG or antibacterial soaps. If your skin becomes reddened/irritated stop using the CHG.  Do not shave (including legs and underarms) for at least 48 hours prior to first CHG shower. It is OK to shave your face.  Please follow these instructions carefully.   1. Shower the NIGHT BEFORE SURGERY and the MORNING OF SURGERY with CHG Soap.   2. If you chose to wash your hair, wash your hair first as usual with your  normal shampoo.  3. After you shampoo, rinse your hair and body thoroughly to remove the shampoo.  4. Use CHG as you would any other liquid soap. You can apply CHG directly to the skin and wash gently with a scrungie or a clean washcloth.   5. Apply the CHG Soap to your body ONLY FROM THE NECK DOWN.  Do not use on open wounds or open sores. Avoid contact with your eyes, ears, mouth and genitals (private parts). Wash Face and genitals (private parts)  with your normal soap.   6. Wash thoroughly, paying special attention to the area where your surgery will be performed.  7. Thoroughly rinse your body with warm water from the neck down.  8. DO NOT shower/wash with your normal soap after using and rinsing off the CHG Soap.  9. Pat yourself dry with a CLEAN TOWEL.  10. Wear CLEAN PAJAMAS to bed the night before surgery, wear comfortable clothes the morning of surgery  11. Place CLEAN SHEETS on your bed the night of your first shower and DO NOT SLEEP WITH PETS.   Day of Surgery:   Do not apply any deodorants/lotions.  Please wear clean clothes to the hospital/surgery center.   Remember to brush your teeth WITH YOUR REGULAR TOOTHPASTE.   Please read over the following fact sheets that you were given.

## 2020-03-13 NOTE — Anesthesia Preprocedure Evaluation (Addendum)
Anesthesia Evaluation  Patient identified by MRN, date of birth, ID band Patient awake    Reviewed: Allergy & Precautions, NPO status , Patient's Chart, lab work & pertinent test results  Airway Mallampati: III  TM Distance: >3 FB Neck ROM: Full    Dental no notable dental hx. (+) Dental Advisory Given, Teeth Intact   Pulmonary sleep apnea ,    Pulmonary exam normal breath sounds clear to auscultation       Cardiovascular hypertension, Pt. on medications Normal cardiovascular exam Rhythm:Regular Rate:Normal     Neuro/Psych negative neurological ROS  negative psych ROS   GI/Hepatic Neg liver ROS, GERD  ,  Endo/Other  negative endocrine ROS  Renal/GU negative Renal ROS     Musculoskeletal negative musculoskeletal ROS (+)   Abdominal   Peds  Hematology negative hematology ROS (+)   Anesthesia Other Findings   Reproductive/Obstetrics                           Anesthesia Physical Anesthesia Plan  ASA: III  Anesthesia Plan: General and Epidural   Post-op Pain Management: GA combined w/ Regional for post-op pain   Induction: Intravenous  PONV Risk Score and Plan:   Airway Management Planned: Oral ETT  Additional Equipment: Arterial line  Intra-op Plan:   Post-operative Plan: Possible Post-op intubation/ventilation  Informed Consent: I have reviewed the patients History and Physical, chart, labs and discussed the procedure including the risks, benefits and alternatives for the proposed anesthesia with the patient or authorized representative who has indicated his/her understanding and acceptance.     Dental advisory given  Plan Discussed with: CRNA  Anesthesia Plan Comments: (Discussed risk/benefits of epidural. Denies anticoagulants, lab studies adequate. Plan T8-9 epidural.      2 x 18g+ IVs          Evaluated pt at PAT due to report of new LLE swelling. He  states that last night he noticed he left foot and ankle to be swollen and painful, moreso than usual. He said he was sitting in a chair with his legs crossed (which he says he does not normally do) and noticed the swelling when he got up. Denied any other symptoms at that time, no CP or SOB.  Swelling resolved overnight. He reports he has been consuming a lot of sodium lately. He is on a liquid diet in preparation for upcoming surgery and says he has been drinking large amounts of low-sodium chicken broth (he was also previously adding worcestershire and tabasco sauce, both of which are high in sodium, but says he has stopped) . He says he does not drink much plain water. He has also been more sedentary recently due to fatigue. Denies SOB or orthopnea. On exam he has bilateral 1-2+ pitting LE edema, swelling may be slightly more prominent on the left. There is no redness, warmth, or calf tightness. He says current level of swelling is essentially at baseline. He is no longer having pain.  He was advised to reduce sodium intake, elevate LEs when possible, ambulate frequently, and if swelling worsens he is to contact PCP for further management. He says his wife is a retired Therapist, sports and is also helping him to monitor.  He reports prolonged emergence after previous shoulder surgery. Says anesthesiologist initially tried block, but that failed and he required GA with intubation. Says he took a long time to "wake up" in PACU but denies any anesthetic complications.   OSA  on CPAP.  Preop labs reviewed, unremarkable.  Vitals WNL.   EKG 03/12/20: NSR. Rate 64.)      Anesthesia Quick Evaluation                                  Anesthesia Evaluation  Patient identified by MRN, date of birth, ID band Patient awake    Reviewed: Allergy & Precautions, NPO status , Patient's Chart, lab work & pertinent test results  Airway Mallampati: I       Dental no notable dental hx. (+) Teeth Intact    Pulmonary sleep apnea ,    breath sounds clear to auscultation       Cardiovascular hypertension, Pt. on medications Normal cardiovascular exam Rhythm:Regular Rate:Normal     Neuro/Psych    GI/Hepatic   Endo/Other    Renal/GU      Musculoskeletal   Abdominal Normal abdominal exam  (+)   Peds  Hematology   Anesthesia Other Findings   Reproductive/Obstetrics                            Anesthesia Physical Anesthesia Plan  ASA: II  Anesthesia Plan: MAC   Post-op Pain Management:    Induction:   PONV Risk Score and Plan: Propofol infusion  Airway Management Planned:   Additional Equipment: None  Intra-op Plan:   Post-operative Plan:   Informed Consent: I have reviewed the patients History and Physical, chart, labs and discussed the procedure including the risks, benefits and alternatives for the proposed anesthesia with the patient or authorized representative who has indicated his/her understanding and acceptance.       Plan Discussed with: CRNA  Anesthesia Plan Comments:         Anesthesia Quick Evaluation

## 2020-03-13 NOTE — Progress Notes (Addendum)
Anesthesia Chart Review:  Evaluated pt at PAT due to report of new LLE swelling. He states that last night he noticed he left foot and ankle to be swollen and painful, moreso than usual. He said he was sitting in a chair with his legs crossed (which he says he does not normally do) and noticed the swelling when he got up. Denied any other symptoms at that time, no CP or SOB.  Swelling resolved overnight. He reports he has been consuming a lot of sodium lately. He is on a liquid diet in preparation for upcoming surgery and says he has been drinking large amounts of low-sodium chicken broth (he was also previously adding worcestershire and tabasco sauce, both of which are high in sodium, but says he has stopped) . He says he does not drink much plain water. He has also been more sedentary recently due to fatigue. Denies SOB or orthopnea. On exam he has bilateral 1-2+ pitting LE edema, swelling may be slightly more prominent on the left. There is no redness, warmth, or calf tightness. He says current level of swelling is essentially at baseline. He is no longer having pain.  He was advised to reduce sodium intake, elevate LEs when possible, ambulate frequently, and if swelling worsens he is to contact PCP for further management. He says his wife is a retired Therapist, sports and is also helping him to monitor.  He reports prolonged emergence after previous shoulder surgery. Says anesthesiologist initially tried block, but that failed and he required GA with intubation. Says he took a long time to "wake up" in PACU but denies any anesthetic complications.   OSA on CPAP.  Preop labs reviewed, unremarkable.  Vitals WNL.   EKG 03/12/20: NSR. Rate 64.   Wynonia Musty [facility] Short Stay Center/Anesthesiology Phone [phone] 03/13/2020 3:19 PM

## 2020-03-17 ENCOUNTER — Other Ambulatory Visit (HOSPITAL_COMMUNITY)
Admission: RE | Admit: 2020-03-17 | Discharge: 2020-03-17 | Disposition: A | Discharge status: Home / Self Care | Payer: Medicare Other | Source: Ambulatory Visit | Attending: General Surgery | Admitting: General Surgery

## 2020-03-17 DIAGNOSIS — Z01812 Encounter for preprocedural laboratory examination: Secondary | ICD-10-CM | POA: Insufficient documentation

## 2020-03-17 DIAGNOSIS — Z20822 Contact with and (suspected) exposure to covid-19: Secondary | ICD-10-CM | POA: Insufficient documentation

## 2020-03-17 LAB — SARS CORONAVIRUS 2 (TAT 6-24 HRS): SARS Coronavirus 2: NEGATIVE

## 2020-03-18 NOTE — H&P (Signed)
Annye English Location: [place] Office Patient #: [id number] DOB: 02/09/1946 Married / Language: Undefined / Race: White Male   History of Present Illness  The patient is a 74 year old male who presents with a gastric outlet obstruction. Pt is a 74 yo M referred by Dr. Penelope Coop for duodenal obstruction. The patient presented with around a 50-70 pound weight loss since christmas. He doesn't have much of an appetite. He has periodically had severe episodes of throwing up. He states that he doesn't really feel nauseated, but will just be suddenly be overtaken by a need to have "projectile vomiting." His is "way more than what he ate." He has been mostly eating liquids and some soft solids, however, even soft solids have been coming back up. He cannot eat meat or chunks of vegetables in soup. He hasn't been able to tolerate mashed potatoes, even. He has sleep apnea and his mask isn't fitting right recently. He feels weak since he can't eat much.   He has been seen to have a mass/stricture in the fourth portion of the duodenum on endoscopy. On CT it looks more like the third portion. Biopsies have been unrevealing. These show chronic inflammation without dysplasia.    CT 01/03/2020 IMPRESSION: 1. Wall thickening of the fourth portion duodenum just proximal to the ligament of Treitz, with surrounding fat stranding and pathologic mesenteric adenopathy. Given the clinical history, neoplasm is the diagnosis of exclusion. Endoscopy is recommended for further evaluation. 2. Enlarged prostate. 3. Fat containing supraumbilical ventral hernia  MR abdomen 01/16/2020 IMPRESSION: 1. Ill-defined wall thickening involving the third portion of the duodenum with surrounding ill-defined interstitial changes and soft tissue density. There is also adjacent mesenteric adenopathy. Findings could be due to the sequela of duodenitis but could not exclude neoplastic process such as carcinoid tumor.  Endoscopy may be helpful for further evaluation and diagnostic purposes. Please see above discussion. 2. Status post cholecystectomy. No biliary dilatation. 3. Stable renal cysts.  mr pelvis 01/16/2020  IMPRESSION: 1. No evidence for metastatic disease in the pelvis. 2. Prostatomegaly  Small bowel enteroscopy 01/24/2020 [place] A stenosis was found in the fourth portion of the duodenum. The scope would not pass beyond. There was a mass in this area. Findings: A medium-sized mass with no bleeding was found in the fourth portion of the duodenum. Biopsies were taken with a cold forceps for histology. Area was tattooed with an injection of 3 mL of [place] ink. Esophagitis was found. Previously seen and biopsied.  pathology 01/24/2020 A. 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 CPAP Active. Ketoconazole (2% Cream, External) Active. Multivitamin (Oral) Active. Omeprazole (40MG  Capsule DR, Oral) Active. Lisinopril (10MG  Tablet, Oral) Active. Hydrocortisone (2.5% Cream, External) Active. Medications Reconciled  Social History Alcohol use  Occasional alcohol use. No drug use  Tobacco use  Never smoker.  Family History Colon Polyps  Brother. Hypertension  Mother. Respiratory Condition  Father. Thyroid problems  Sister.  Other Problems Gastroesophageal Reflux Disease  General anesthesia - complications  Hemorrhoids  High blood pressure  Sleep Apnea  Review of Systems General Present-  Appetite Loss, Fatigue and Weight Loss. Not Present- Chills, Fever, Night Sweats and Weight Gain. Skin Not Present- Change in Wart/Mole, Dryness, Hives, Jaundice, New Lesions, Non-Healing Wounds, Rash and Ulcer. HEENT Present- Seasonal Allergies and Wears glasses/contact lenses. Not Present- Earache, Hearing Loss, Hoarseness, Nose Bleed, Oral Ulcers, Ringing in the Ears, Sinus Pain, Sore Throat, Visual Disturbances and Yellow Eyes. Respiratory Not Present- Bloody sputum, Chronic Cough, Difficulty Breathing, Snoring and Wheezing. Breast Not Present- Breast Mass, Breast Pain, Nipple Discharge and Skin Changes. Cardiovascular Not Present- Chest Pain, Difficulty Breathing Lying Down, Leg Cramps, Palpitations, Rapid Heart Rate, Shortness of Breath and Swelling of Extremities. Gastrointestinal Present- Change in Bowel Habits, Gets full quickly at meals, Hemorrhoids and Vomiting. Not Present- Abdominal Pain, Bloating, Bloody Stool, Chronic diarrhea, Constipation, Difficulty Swallowing, Excessive gas, Indigestion, Nausea and Rectal Pain. Male Genitourinary Present- Impotence. Not Present- Blood in Urine, Change in Urinary Stream, Frequency, Nocturia, Painful Urination, Urgency and Urine Leakage. Musculoskeletal Not Present- Back Pain, Joint Pain, Joint Stiffness, Muscle Pain, Muscle Weakness and Swelling of Extremities. Neurological Not Present- Decreased Memory, Fainting, Headaches, Numbness, Seizures, Tingling, Tremor, Trouble walking and Weakness. Psychiatric Not Present- Anxiety, Bipolar, Change in Sleep Pattern, Depression, Fearful and Frequent crying. Endocrine Not Present- Cold Intolerance, Excessive Hunger, Hair Changes, Heat Intolerance, Hot flashes and New Diabetes. Hematology Not Present- Blood Thinners, Easy Bruising, Excessive bleeding, Gland problems, HIV and Persistent Infections.  Vitals  Weight: 216.25 lb Height: 72.5in Body Surface Area: 2.21 m Body Mass Index: 28.93 kg/m  Temp.:  97.82F (Tympanic)  Pulse: 68 (Regular)  P.OX: 98% (Room air) BP: 118/72(Sitting, Right Arm, Standard)       Physical Exam  General Mental Status-Alert. General Appearance-Consistent with stated age. Hydration-Well hydrated. Voice-Normal. Note: Very apparent he has recently lost quite a bit of weight. pants too large with suspenders. Shirts hanging on him.   Head and Neck Head-normocephalic, atraumatic with no lesions or palpable masses. Trachea-midline. Thyroid Gland Characteristics - normal size and consistency.  Eye Eyeball - Bilateral-Extraocular movements intact. Sclera/Conjunctiva - Bilateral-No scleral icterus.  Chest and Lung Exam Chest and lung exam reveals -quiet, even and easy respiratory effort with no use of accessory muscles and on auscultation, normal breath sounds, no adventitious sounds and normal vocal resonance. Inspection Chest Wall - Normal. Back - normal.  Cardiovascular Cardiovascular examination reveals -normal heart sounds, regular rate and rhythm with no murmurs and normal pedal pulses bilaterally.  Abdomen Inspection Inspection of the abdomen reveals - No Hernias. Palpation/Percussion Palpation and Percussion of the abdomen reveal - Soft, Non Tender, No Rebound tenderness, No Rigidity (guarding) and No hepatosplenomegaly. Auscultation Auscultation of the abdomen reveals - Bowel sounds normal.  Neurologic Neurologic evaluation reveals -alert and oriented x 3 with no impairment of recent or remote memory. Mental Status-Normal.  Musculoskeletal Global Assessment -Note: no gross deformities.  Normal Exam - Left-Upper Extremity Strength Normal and Lower Extremity Strength Normal. Normal Exam - Right-Upper Extremity Strength Normal and Lower Extremity Strength Normal.  Lymphatic Head & Neck  General Head & Neck Lymphatics: Bilateral - Description - Normal. Axillary  General Axillary Region: Bilateral  - Description - Normal. Tenderness - Non Tender. Femoral & Inguinal  Generalized Femoral & Inguinal Lymphatics: Bilateral - Description - No Generalized lymphadenopathy.    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  I discussed that this surgical plan is a bit complicated. I advised that we have several goals for surgery and  that the primary one is to relieve the obstruction. The secondary goal would be to get a diagnosis and the third would be to remove the area.  If this is truly 4th portion and is not adherent to anything, we may be able to resect this and send for frozen section. If not malignant, I could do a duodenojejunostomy along with feeding tube.  If this is not resectable or if metastatic disease is present, I would do GJ along with G tube and J tube.  Otherwise, if obviously larger/firmer tumor, he would need a whipple to get this out with negative margins and to fully assess lymph node status.  I reviewed the anatomy in this area along with risks of surgery. I discussed risk of pancreatic leak, bleeding, infection, damage to adjacent structures, heart/lung issues, blood clot, death, and more.  His wife is a retired Marine scientist which should be helpful for post op tube feeds. In cases of gastric outlet obstruction, it usually takes a while for patients to recover gastric motility.    This is a very medically complex situation. Current Plans You are being scheduled for surgery- Our schedulers will call you.  You should hear from our office's scheduling department within 5 working days about the location, date, and time of surgery. We try to make accommodations for patient's preferences in scheduling surgery, but sometimes the OR schedule or the surgeon's schedule prevents [place] from making those accommodations.  If you have not heard from our office [phone]) in 5 working days, call the office and ask for your surgeon's  nurse.  If you have other questions about your diagnosis, plan, or surgery, call the office and ask for your surgeon's nurse.  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 EXCESSIVE WEIGHT LOSS (R63.4) Impression: Pt will need to stay on full liquids pre op. Discussed calorie dense foods with patient and wife. SEVERE PROTEIN-CALORIE MALNUTRITION (E43) Impression: Will check labs to see albumin and prealbumin to assess how bad this is. He may need pre op TPN.

## 2020-03-20 ENCOUNTER — Inpatient Hospital Stay (HOSPITAL_COMMUNITY): Payer: Medicare Other | Admitting: Registered Nurse

## 2020-03-20 ENCOUNTER — Other Ambulatory Visit: Payer: Self-pay | Admitting: Anatomic Pathology & Clinical Pathology

## 2020-03-20 ENCOUNTER — Encounter (HOSPITAL_COMMUNITY): Payer: Self-pay | Admitting: General Surgery

## 2020-03-20 ENCOUNTER — Inpatient Hospital Stay (HOSPITAL_COMMUNITY): Payer: Medicare Other | Admitting: Physician Assistant

## 2020-03-20 ENCOUNTER — Inpatient Hospital Stay (HOSPITAL_COMMUNITY)
Admission: RE | Admit: 2020-03-20 | Discharge: 2020-04-07 | DRG: 326 | Disposition: A | Discharge status: Home Health | Payer: Medicare Other | Attending: General Surgery | Admitting: General Surgery

## 2020-03-20 ENCOUNTER — Other Ambulatory Visit: Payer: Self-pay

## 2020-03-20 ENCOUNTER — Encounter (HOSPITAL_COMMUNITY)
Admission: RE | Disposition: A | Discharge status: Home / Self Care | Payer: Self-pay | Source: Home / Self Care | Attending: General Surgery

## 2020-03-20 DIAGNOSIS — Z0189 Encounter for other specified special examinations: Secondary | ICD-10-CM

## 2020-03-20 DIAGNOSIS — K3189 Other diseases of stomach and duodenum: Secondary | ICD-10-CM | POA: Diagnosis present

## 2020-03-20 DIAGNOSIS — Z882 Allergy status to sulfonamides status: Secondary | ICD-10-CM | POA: Diagnosis not present

## 2020-03-20 DIAGNOSIS — Z880 Allergy status to penicillin: Secondary | ICD-10-CM | POA: Diagnosis not present

## 2020-03-20 DIAGNOSIS — E871 Hypo-osmolality and hyponatremia: Secondary | ICD-10-CM | POA: Diagnosis not present

## 2020-03-20 DIAGNOSIS — C8333 Diffuse large B-cell lymphoma, intra-abdominal lymph nodes: Secondary | ICD-10-CM | POA: Diagnosis present

## 2020-03-20 DIAGNOSIS — Z79899 Other long term (current) drug therapy: Secondary | ICD-10-CM | POA: Diagnosis not present

## 2020-03-20 DIAGNOSIS — Z86010 Personal history of colonic polyps: Secondary | ICD-10-CM | POA: Diagnosis not present

## 2020-03-20 DIAGNOSIS — T85598A Other mechanical complication of other gastrointestinal prosthetic devices, implants and grafts, initial encounter: Secondary | ICD-10-CM

## 2020-03-20 DIAGNOSIS — E43 Unspecified severe protein-calorie malnutrition: Secondary | ICD-10-CM | POA: Diagnosis present

## 2020-03-20 DIAGNOSIS — Z8371 Family history of colonic polyps: Secondary | ICD-10-CM

## 2020-03-20 DIAGNOSIS — Z9852 Vasectomy status: Secondary | ICD-10-CM | POA: Diagnosis not present

## 2020-03-20 DIAGNOSIS — Z8249 Family history of ischemic heart disease and other diseases of the circulatory system: Secondary | ICD-10-CM | POA: Diagnosis not present

## 2020-03-20 DIAGNOSIS — D62 Acute posthemorrhagic anemia: Secondary | ICD-10-CM | POA: Diagnosis not present

## 2020-03-20 DIAGNOSIS — Z9089 Acquired absence of other organs: Secondary | ICD-10-CM | POA: Diagnosis not present

## 2020-03-20 DIAGNOSIS — E876 Hypokalemia: Secondary | ICD-10-CM | POA: Diagnosis not present

## 2020-03-20 DIAGNOSIS — K219 Gastro-esophageal reflux disease without esophagitis: Secondary | ICD-10-CM | POA: Diagnosis present

## 2020-03-20 DIAGNOSIS — Z836 Family history of other diseases of the respiratory system: Secondary | ICD-10-CM

## 2020-03-20 DIAGNOSIS — I10 Essential (primary) hypertension: Secondary | ICD-10-CM | POA: Diagnosis present

## 2020-03-20 DIAGNOSIS — Z20822 Contact with and (suspected) exposure to covid-19: Secondary | ICD-10-CM | POA: Diagnosis present

## 2020-03-20 DIAGNOSIS — Z9049 Acquired absence of other specified parts of digestive tract: Secondary | ICD-10-CM | POA: Diagnosis not present

## 2020-03-20 DIAGNOSIS — G4730 Sleep apnea, unspecified: Secondary | ICD-10-CM | POA: Diagnosis present

## 2020-03-20 DIAGNOSIS — Z8349 Family history of other endocrine, nutritional and metabolic diseases: Secondary | ICD-10-CM

## 2020-03-20 DIAGNOSIS — C170 Malignant neoplasm of duodenum: Secondary | ICD-10-CM | POA: Diagnosis present

## 2020-03-20 DIAGNOSIS — R112 Nausea with vomiting, unspecified: Secondary | ICD-10-CM

## 2020-03-20 HISTORY — PX: GASTROJEJUNOSTOMY: SHX1697

## 2020-03-20 HISTORY — PX: PYLOROPLASTY: SHX418

## 2020-03-20 HISTORY — PX: GASTROSTOMY: SHX5249

## 2020-03-20 HISTORY — PX: BOWEL RESECTION: SHX1257

## 2020-03-20 HISTORY — PX: LAPAROSCOPY: SHX197

## 2020-03-20 LAB — MRSA PCR SCREENING: MRSA by PCR: NEGATIVE

## 2020-03-20 SURGERY — LAPAROSCOPY, DIAGNOSTIC
Anesthesia: Epidural | Site: Abdomen

## 2020-03-20 MED ORDER — ACETAMINOPHEN 500 MG PO TABS
1000.0000 mg | ORAL_TABLET | ORAL | Status: AC
  Administered 2020-03-20: 1000 mg via ORAL
  Filled 2020-03-20: qty 2

## 2020-03-20 MED ORDER — DIPHENHYDRAMINE HCL 12.5 MG/5ML PO ELIX: 12.5000 mg | ORAL_SOLUTION | Freq: Four times a day (QID) | ORAL | Status: DC | PRN

## 2020-03-20 MED ORDER — CHLORHEXIDINE GLUCONATE CLOTH 2 % EX PADS: 6.0000 | MEDICATED_PAD | Freq: Once | CUTANEOUS | Status: DC

## 2020-03-20 MED ORDER — 0.9 % SODIUM CHLORIDE (POUR BTL) OPTIME
TOPICAL | Status: DC | PRN
  Administered 2020-03-20 (×2): 1000 mL

## 2020-03-20 MED ORDER — HYDROMORPHONE HCL 1 MG/ML IJ SOLN: 0.2500 mg | INTRAMUSCULAR | Status: DC | PRN

## 2020-03-20 MED ORDER — ONDANSETRON HCL 4 MG/2ML IJ SOLN
INTRAMUSCULAR | Status: DC | PRN
  Administered 2020-03-20: 4 mg via INTRAVENOUS

## 2020-03-20 MED ORDER — PHENYLEPHRINE 40 MCG/ML (10ML) SYRINGE FOR IV PUSH (FOR BLOOD PRESSURE SUPPORT)
PREFILLED_SYRINGE | INTRAVENOUS | Status: AC
  Filled 2020-03-20: qty 10

## 2020-03-20 MED ORDER — KETAMINE HCL 10 MG/ML IJ SOLN
INTRAMUSCULAR | Status: DC | PRN
  Administered 2020-03-20: 50 mg via INTRAVENOUS
  Administered 2020-03-20: 10 mg via INTRAVENOUS

## 2020-03-20 MED ORDER — ACETAMINOPHEN 325 MG PO TABS: 650.0000 mg | ORAL_TABLET | Freq: Four times a day (QID) | ORAL | Status: DC | PRN

## 2020-03-20 MED ORDER — ROCURONIUM BROMIDE 10 MG/ML (PF) SYRINGE
PREFILLED_SYRINGE | INTRAVENOUS | Status: DC | PRN
  Administered 2020-03-20: 100 mg via INTRAVENOUS
  Administered 2020-03-20: 20 mg via INTRAVENOUS
  Administered 2020-03-20: 30 mg via INTRAVENOUS

## 2020-03-20 MED ORDER — SUGAMMADEX SODIUM 200 MG/2ML IV SOLN
INTRAVENOUS | Status: DC | PRN
  Administered 2020-03-20 (×3): 100 mg via INTRAVENOUS

## 2020-03-20 MED ORDER — ALBUMIN HUMAN 5 % IV SOLN: INTRAVENOUS | Status: DC | PRN

## 2020-03-20 MED ORDER — LIDOCAINE 2% (20 MG/ML) 5 ML SYRINGE
INTRAMUSCULAR | Status: DC | PRN
  Administered 2020-03-20: 100 mg via INTRAVENOUS

## 2020-03-20 MED ORDER — LIDOCAINE-EPINEPHRINE 1 %-1:100000 IJ SOLN
INTRAMUSCULAR | Status: DC | PRN
  Administered 2020-03-20: 20 mL

## 2020-03-20 MED ORDER — KETAMINE HCL 50 MG/5ML IJ SOSY
PREFILLED_SYRINGE | INTRAMUSCULAR | Status: AC
  Filled 2020-03-20: qty 10

## 2020-03-20 MED ORDER — PANTOPRAZOLE SODIUM 40 MG IV SOLR
40.0000 mg | Freq: Every day | INTRAVENOUS | Status: DC
  Administered 2020-03-20 – 2020-04-06 (×18): 40 mg via INTRAVENOUS
  Filled 2020-03-20 (×18): qty 40

## 2020-03-20 MED ORDER — SODIUM CHLORIDE 0.9 % IV SOLN
INTRAVENOUS | Status: DC | PRN
  Administered 2020-03-20: 250 mL via INTRAVENOUS

## 2020-03-20 MED ORDER — LACTATED RINGERS IV SOLN: INTRAVENOUS | Status: DC | PRN

## 2020-03-20 MED ORDER — ACETAMINOPHEN 650 MG RE SUPP: 650.0000 mg | Freq: Four times a day (QID) | RECTAL | Status: DC | PRN

## 2020-03-20 MED ORDER — PROPOFOL 10 MG/ML IV BOLUS
INTRAVENOUS | Status: DC | PRN
  Administered 2020-03-20: 140 mg via INTRAVENOUS

## 2020-03-20 MED ORDER — PHENYLEPHRINE HCL-NACL 10-0.9 MG/250ML-% IV SOLN
INTRAVENOUS | Status: DC | PRN
  Administered 2020-03-20: 50 ug/min via INTRAVENOUS

## 2020-03-20 MED ORDER — DIPHENHYDRAMINE HCL 50 MG/ML IJ SOLN: 12.5000 mg | Freq: Four times a day (QID) | INTRAMUSCULAR | Status: DC | PRN

## 2020-03-20 MED ORDER — NALOXONE HCL 0.4 MG/ML IJ SOLN: 0.4000 mg | INTRAMUSCULAR | Status: DC | PRN

## 2020-03-20 MED ORDER — METHOCARBAMOL 1000 MG/10ML IJ SOLN
500.0000 mg | Freq: Three times a day (TID) | INTRAVENOUS | Status: DC | PRN
  Administered 2020-03-20: 500 mg via INTRAVENOUS
  Filled 2020-03-20 (×6): qty 5

## 2020-03-20 MED ORDER — PHENYLEPHRINE 40 MCG/ML (10ML) SYRINGE FOR IV PUSH (FOR BLOOD PRESSURE SUPPORT)
PREFILLED_SYRINGE | INTRAVENOUS | Status: DC | PRN
  Administered 2020-03-20 (×3): 120 ug via INTRAVENOUS
  Administered 2020-03-20 (×3): 80 ug via INTRAVENOUS

## 2020-03-20 MED ORDER — ENSURE PRE-SURGERY PO LIQD: 592.0000 mL | Freq: Once | ORAL | Status: DC

## 2020-03-20 MED ORDER — ALBUMIN HUMAN 5 % IV SOLN
12.5000 g | Freq: Once | INTRAVENOUS | Status: AC
  Administered 2020-03-20: 12.5 g via INTRAVENOUS

## 2020-03-20 MED ORDER — BUPIVACAINE HCL (PF) 0.25 % IJ SOLN
INTRAMUSCULAR | Status: AC
  Filled 2020-03-20: qty 30

## 2020-03-20 MED ORDER — CIPROFLOXACIN IN D5W 400 MG/200ML IV SOLN
400.0000 mg | Freq: Two times a day (BID) | INTRAVENOUS | Status: AC
  Administered 2020-03-20: 400 mg via INTRAVENOUS
  Filled 2020-03-20: qty 200

## 2020-03-20 MED ORDER — ROPIVACAINE HCL 2 MG/ML IJ SOLN
6.0000 mL/h | INTRAMUSCULAR | Status: DC
  Administered 2020-03-20 – 2020-03-22 (×2): 8 mL/h via EPIDURAL
  Filled 2020-03-20 (×4): qty 200

## 2020-03-20 MED ORDER — ROCURONIUM BROMIDE 10 MG/ML (PF) SYRINGE
PREFILLED_SYRINGE | INTRAVENOUS | Status: AC
  Filled 2020-03-20: qty 20

## 2020-03-20 MED ORDER — PROCHLORPERAZINE EDISYLATE 10 MG/2ML IJ SOLN
5.0000 mg | Freq: Four times a day (QID) | INTRAMUSCULAR | Status: DC | PRN
  Administered 2020-03-31 – 2020-04-06 (×6): 10 mg via INTRAVENOUS
  Filled 2020-03-20 (×6): qty 2

## 2020-03-20 MED ORDER — PROPOFOL 1000 MG/100ML IV EMUL
INTRAVENOUS | Status: AC
  Filled 2020-03-20: qty 100

## 2020-03-20 MED ORDER — KETOCONAZOLE 2 % EX CREA
1.0000 "application " | TOPICAL_CREAM | CUTANEOUS | Status: DC
  Administered 2020-03-20 – 2020-03-31 (×3): 1 via TOPICAL
  Filled 2020-03-20 (×3): qty 15

## 2020-03-20 MED ORDER — PROPOFOL 10 MG/ML IV BOLUS
INTRAVENOUS | Status: AC
  Filled 2020-03-20: qty 20

## 2020-03-20 MED ORDER — MORPHINE SULFATE 2 MG/ML IV SOLN
INTRAVENOUS | Status: DC
  Administered 2020-03-21: 4 mg via INTRAVENOUS
  Administered 2020-03-21: 5 mg via INTRAVENOUS
  Administered 2020-03-21: 13 mg via INTRAVENOUS
  Administered 2020-03-21: 6 mg via INTRAVENOUS
  Administered 2020-03-21: 15 mg via INTRAVENOUS
  Administered 2020-03-21: 5 mg via INTRAVENOUS
  Administered 2020-03-22: 3 mg via INTRAVENOUS
  Administered 2020-03-22: 7 mg via INTRAVENOUS
  Administered 2020-03-22: 11 mg via INTRAVENOUS
  Administered 2020-03-22: 5 mg via INTRAVENOUS
  Administered 2020-03-22: 2 mg via INTRAVENOUS
  Administered 2020-03-23: 4 mg via INTRAVENOUS
  Administered 2020-03-23: 6 mg via INTRAVENOUS
  Administered 2020-03-23: 7 mg via INTRAVENOUS
  Administered 2020-03-23: 1 mg via INTRAVENOUS
  Administered 2020-03-23: 3 mg via INTRAVENOUS
  Administered 2020-03-23: 7 mg via INTRAVENOUS
  Administered 2020-03-24: 5 mg via INTRAVENOUS
  Administered 2020-03-24: 8 mg via INTRAVENOUS
  Administered 2020-03-24: 4 mg via INTRAVENOUS
  Administered 2020-03-24: 10 mg via INTRAVENOUS
  Administered 2020-03-24: 3 mg via INTRAVENOUS
  Administered 2020-03-24: 6 mg via INTRAVENOUS
  Administered 2020-03-25: 7 mg via INTRAVENOUS
  Administered 2020-03-25 (×2): 5 mg via INTRAVENOUS
  Administered 2020-03-25: 8 mg via INTRAVENOUS
  Administered 2020-03-25: 6 mg via INTRAVENOUS
  Administered 2020-03-25: 4 mg via INTRAVENOUS
  Administered 2020-03-25: 1 mg via INTRAVENOUS
  Administered 2020-03-26: 0 mg via INTRAVENOUS
  Administered 2020-03-26: 1 mg via INTRAVENOUS
  Administered 2020-03-26: 6 mg via INTRAVENOUS
  Administered 2020-03-26: 4 mg via INTRAVENOUS
  Administered 2020-03-26: 10 mg via INTRAVENOUS
  Administered 2020-03-27 (×2): 2 mg via INTRAVENOUS
  Administered 2020-03-27: 5 mg via INTRAVENOUS
  Filled 2020-03-20 (×4): qty 30

## 2020-03-20 MED ORDER — FENTANYL CITRATE (PF) 250 MCG/5ML IJ SOLN
INTRAMUSCULAR | Status: DC | PRN
  Administered 2020-03-20: 100 ug via INTRAVENOUS

## 2020-03-20 MED ORDER — PROCHLORPERAZINE MALEATE 10 MG PO TABS
10.0000 mg | ORAL_TABLET | Freq: Four times a day (QID) | ORAL | Status: DC | PRN
  Filled 2020-03-20: qty 1

## 2020-03-20 MED ORDER — SODIUM CHLORIDE 0.9% FLUSH: 9.0000 mL | INTRAVENOUS | Status: DC | PRN

## 2020-03-20 MED ORDER — KCL IN DEXTROSE-NACL 20-5-0.45 MEQ/L-%-% IV SOLN
INTRAVENOUS | Status: AC
  Filled 2020-03-20: qty 1000

## 2020-03-20 MED ORDER — ONDANSETRON HCL 4 MG/2ML IJ SOLN
INTRAMUSCULAR | Status: AC
  Filled 2020-03-20: qty 2

## 2020-03-20 MED ORDER — METOPROLOL TARTRATE 5 MG/5ML IV SOLN: 5.0000 mg | Freq: Four times a day (QID) | INTRAVENOUS | Status: DC | PRN

## 2020-03-20 MED ORDER — LIDOCAINE 2% (20 MG/ML) 5 ML SYRINGE
INTRAMUSCULAR | Status: AC
  Filled 2020-03-20: qty 5

## 2020-03-20 MED ORDER — EPHEDRINE SULFATE-NACL 50-0.9 MG/10ML-% IV SOSY
PREFILLED_SYRINGE | INTRAVENOUS | Status: DC | PRN
  Administered 2020-03-20: 10 mg via INTRAVENOUS

## 2020-03-20 MED ORDER — KCL IN DEXTROSE-NACL 20-5-0.45 MEQ/L-%-% IV SOLN
INTRAVENOUS | Status: DC
  Administered 2020-03-20: 100 mL/h via INTRAVENOUS
  Filled 2020-03-20: qty 1000

## 2020-03-20 MED ORDER — CHLORHEXIDINE GLUCONATE CLOTH 2 % EX PADS
6.0000 | MEDICATED_PAD | Freq: Every day | CUTANEOUS | Status: DC
  Administered 2020-03-21 – 2020-04-07 (×12): 6 via TOPICAL

## 2020-03-20 MED ORDER — ONDANSETRON HCL 4 MG/2ML IJ SOLN
4.0000 mg | Freq: Four times a day (QID) | INTRAMUSCULAR | Status: DC | PRN
  Administered 2020-03-26: 03:00:00 4 mg via INTRAVENOUS
  Filled 2020-03-20: qty 2

## 2020-03-20 MED ORDER — ONDANSETRON HCL 4 MG/2ML IJ SOLN: 4.0000 mg | Freq: Once | INTRAMUSCULAR | Status: DC | PRN

## 2020-03-20 MED ORDER — DEXAMETHASONE SODIUM PHOSPHATE 10 MG/ML IJ SOLN
INTRAMUSCULAR | Status: AC
  Filled 2020-03-20: qty 1

## 2020-03-20 MED ORDER — WATER FOR IRRIGATION, STERILE IR SOLN
Status: DC | PRN
  Administered 2020-03-20 (×2): 1000 mL

## 2020-03-20 MED ORDER — PROPOFOL 500 MG/50ML IV EMUL
INTRAVENOUS | Status: DC | PRN
  Administered 2020-03-20: 25 ug/kg/min via INTRAVENOUS

## 2020-03-20 MED ORDER — LIDOCAINE-EPINEPHRINE (PF) 1.5 %-1:200000 IJ SOLN
INTRAMUSCULAR | Status: DC | PRN
Start: 2020-03-20 — End: 2020-03-20
  Administered 2020-03-20 (×2): 4 mL via EPIDURAL
  Administered 2020-03-20: 3 mL via EPIDURAL

## 2020-03-20 MED ORDER — ACETAMINOPHEN 10 MG/ML IV SOLN
1000.0000 mg | Freq: Four times a day (QID) | INTRAVENOUS | Status: AC
  Administered 2020-03-20 – 2020-03-21 (×3): 1000 mg via INTRAVENOUS
  Filled 2020-03-20 (×3): qty 100

## 2020-03-20 MED ORDER — BUPIVACAINE HCL (PF) 0.25 % IJ SOLN
INTRAMUSCULAR | Status: DC | PRN
  Administered 2020-03-20: 20 mL

## 2020-03-20 MED ORDER — FENTANYL CITRATE (PF) 250 MCG/5ML IJ SOLN
INTRAMUSCULAR | Status: AC
  Filled 2020-03-20: qty 5

## 2020-03-20 MED ORDER — DEXAMETHASONE SODIUM PHOSPHATE 10 MG/ML IJ SOLN
INTRAMUSCULAR | Status: DC | PRN
  Administered 2020-03-20: 5 mg via INTRAVENOUS

## 2020-03-20 MED ORDER — ENSURE PRE-SURGERY PO LIQD: 296.0000 mL | Freq: Once | ORAL | Status: DC

## 2020-03-20 MED ORDER — ALBUMIN HUMAN 5 % IV SOLN
INTRAVENOUS | Status: AC
  Filled 2020-03-20: qty 250

## 2020-03-20 MED ORDER — DIPHENHYDRAMINE HCL 12.5 MG/5ML PO ELIX
12.5000 mg | ORAL_SOLUTION | Freq: Four times a day (QID) | ORAL | Status: DC | PRN
  Filled 2020-03-20: qty 5

## 2020-03-20 MED ORDER — ROPIVACAINE HCL 2 MG/ML IJ SOLN
INTRAMUSCULAR | Status: DC | PRN
Start: 2020-03-20 — End: 2020-03-20
  Administered 2020-03-20: 5 mL via EPIDURAL

## 2020-03-20 MED ORDER — CIPROFLOXACIN IN D5W 400 MG/200ML IV SOLN
400.0000 mg | INTRAVENOUS | Status: AC
  Administered 2020-03-20: 08:00:00 400 mg via INTRAVENOUS
  Filled 2020-03-20: qty 200

## 2020-03-20 MED ORDER — SCOPOLAMINE 1 MG/3DAYS TD PT72
1.0000 | MEDICATED_PATCH | TRANSDERMAL | Status: DC
  Administered 2020-03-20: 1.5 mg via TRANSDERMAL
  Filled 2020-03-20: qty 1

## 2020-03-20 MED ORDER — DIPHENHYDRAMINE HCL 50 MG/ML IJ SOLN
12.5000 mg | Freq: Four times a day (QID) | INTRAMUSCULAR | Status: DC | PRN
  Administered 2020-03-26: 22:00:00 12.5 mg via INTRAVENOUS
  Filled 2020-03-20: qty 1

## 2020-03-20 MED ORDER — MEPERIDINE HCL 25 MG/ML IJ SOLN: 6.2500 mg | INTRAMUSCULAR | Status: DC | PRN

## 2020-03-20 MED ORDER — EPHEDRINE 5 MG/ML INJ
INTRAVENOUS | Status: AC
  Filled 2020-03-20: qty 10

## 2020-03-20 MED ORDER — LIDOCAINE-EPINEPHRINE 1 %-1:100000 IJ SOLN
INTRAMUSCULAR | Status: AC
  Filled 2020-03-20: qty 1

## 2020-03-20 MED ORDER — POLYVINYL ALCOHOL 1.4 % OP SOLN
1.0000 [drp] | Freq: Two times a day (BID) | OPHTHALMIC | Status: DC
  Administered 2020-03-20 – 2020-04-07 (×36): 1 [drp] via OPHTHALMIC
  Filled 2020-03-20: qty 15

## 2020-03-20 SURGICAL SUPPLY — 122 items
BAG BILE T-TUBES STRL (MISCELLANEOUS) ×8 IMPLANT
BAG URINE DRAIN 2000ML AR STRL (UROLOGICAL SUPPLIES) ×4 IMPLANT
BIOPATCH RED 1 DISK 7.0 (GAUZE/BANDAGES/DRESSINGS) ×6 IMPLANT
BIOPATCH RED 1IN DISK 7.0MM (GAUZE/BANDAGES/DRESSINGS) ×2
BLADE CLIPPER SURG (BLADE) ×4 IMPLANT
BLADE SURG 10 STRL SS (BLADE) ×4 IMPLANT
BOOT SUTURE AID YELLOW STND (SUTURE) ×8 IMPLANT
CANISTER SUCT 3000ML PPV (MISCELLANEOUS) ×4 IMPLANT
CATH ROBINSON RED A/P 16FR (CATHETERS) IMPLANT
CHLORAPREP W/TINT 26 (MISCELLANEOUS) ×4 IMPLANT
CLIP VESOCCLUDE LG 6/CT (CLIP) ×4 IMPLANT
CLIP VESOCCLUDE MED 24/CT (CLIP) IMPLANT
CLIP VESOLOCK LG 6/CT PURPLE (CLIP) ×12 IMPLANT
CLIP VESOLOCK MED 6/CT (CLIP) ×4 IMPLANT
CLIP VESOLOCK MED LG 6/CT (CLIP) ×4 IMPLANT
CNTNR URN SCR LID CUP LEK RST (MISCELLANEOUS) ×2 IMPLANT
CONT SPEC 4OZ STRL OR WHT (MISCELLANEOUS) ×2
COUNTER NEEDLE 20 DBL MAG RED (NEEDLE) IMPLANT
COVER MAYO STAND STRL (DRAPES) ×4 IMPLANT
COVER SURGICAL LIGHT HANDLE (MISCELLANEOUS) ×4 IMPLANT
COVER WAND RF STERILE (DRAPES) IMPLANT
DECANTER SPIKE VIAL GLASS SM (MISCELLANEOUS) ×4 IMPLANT
DERMABOND ADVANCED (GAUZE/BANDAGES/DRESSINGS) ×2
DERMABOND ADVANCED .7 DNX12 (GAUZE/BANDAGES/DRESSINGS) ×2 IMPLANT
DRAIN CHANNEL 19F RND (DRAIN) ×8 IMPLANT
DRAIN PENROSE 0.5X18 (DRAIN) IMPLANT
DRAPE LAPAROSCOPIC ABDOMINAL (DRAPES) ×4 IMPLANT
DRAPE WARM FLUID 44X44 (DRAPES) ×4 IMPLANT
DRSG COVADERM 4X10 (GAUZE/BANDAGES/DRESSINGS) IMPLANT
DRSG COVADERM 4X14 (GAUZE/BANDAGES/DRESSINGS) ×4 IMPLANT
DRSG COVADERM 4X6 (GAUZE/BANDAGES/DRESSINGS) IMPLANT
DRSG COVADERM 4X8 (GAUZE/BANDAGES/DRESSINGS) IMPLANT
DRSG COVADERM PLUS 2X2 (GAUZE/BANDAGES/DRESSINGS) ×4 IMPLANT
DRSG TEGADERM 4X4.75 (GAUZE/BANDAGES/DRESSINGS) ×8 IMPLANT
DRSG TELFA 3X8 NADH (GAUZE/BANDAGES/DRESSINGS) IMPLANT
ELECT BLADE 4.0 EZ CLEAN MEGAD (MISCELLANEOUS) ×4
ELECT BLADE 6.5 EXT (BLADE) ×4 IMPLANT
ELECT CAUTERY BLADE 6.4 (BLADE) ×4 IMPLANT
ELECT REM PT RETURN 9FT ADLT (ELECTROSURGICAL) ×4
ELECTRODE BLDE 4.0 EZ CLN MEGD (MISCELLANEOUS) ×2 IMPLANT
ELECTRODE REM PT RTRN 9FT ADLT (ELECTROSURGICAL) ×2 IMPLANT
GAUZE 4X4 16PLY RFD (DISPOSABLE) IMPLANT
GAUZE SPONGE 4X4 12PLY STRL (GAUZE/BANDAGES/DRESSINGS) IMPLANT
GEL ULTRASOUND 20GR AQUASONIC (MISCELLANEOUS) ×4 IMPLANT
GLOVE BIO SURGEON STRL SZ 6 (GLOVE) ×12 IMPLANT
GLOVE INDICATOR 6.5 STRL GRN (GLOVE) ×8 IMPLANT
GOWN STRL REUS W/ TWL LRG LVL3 (GOWN DISPOSABLE) ×12 IMPLANT
GOWN STRL REUS W/TWL 2XL LVL3 (GOWN DISPOSABLE) ×8 IMPLANT
GOWN STRL REUS W/TWL LRG LVL3 (GOWN DISPOSABLE) ×12
HANDLE SUCTION POOLE (INSTRUMENTS) ×2 IMPLANT
HEMOSTAT SURGICEL 2X14 (HEMOSTASIS) IMPLANT
KIT BASIN OR (CUSTOM PROCEDURE TRAY) ×4 IMPLANT
KIT MARKER MARGIN INK (KITS) ×4 IMPLANT
KIT TUBE JEJUNAL 16FR (CATHETERS) IMPLANT
KIT TURNOVER KIT B (KITS) ×4 IMPLANT
L-HOOK LAP DISP 36CM (ELECTROSURGICAL) ×4
LHOOK LAP DISP 36CM (ELECTROSURGICAL) ×2 IMPLANT
LOOP VESSEL MAXI BLUE (MISCELLANEOUS) ×4 IMPLANT
LOOP VESSEL MINI RED (MISCELLANEOUS) ×4 IMPLANT
NEEDLE 22X1 1/2 (OR ONLY) (NEEDLE) ×4 IMPLANT
NS IRRIG 1000ML POUR BTL (IV SOLUTION) ×8 IMPLANT
PACK GENERAL/GYN (CUSTOM PROCEDURE TRAY) IMPLANT
PAD ARMBOARD 7.5X6 YLW CONV (MISCELLANEOUS) ×8 IMPLANT
PENCIL BUTTON HOLSTER BLD 10FT (ELECTRODE) ×4 IMPLANT
PENCIL SMOKE EVACUATOR (MISCELLANEOUS) ×8 IMPLANT
PLUG CATH AND CAP STER (CATHETERS) ×4 IMPLANT
RELOAD PROXIMATE 75MM BLUE (ENDOMECHANICALS) ×8 IMPLANT
RELOAD PROXIMATE 75MM GREEN (ENDOMECHANICALS) IMPLANT
SCISSORS LAP 5X35 DISP (ENDOMECHANICALS) IMPLANT
SEPRAFILM PROCEDURAL PACK 3X5 (MISCELLANEOUS) IMPLANT
SET IRRIG TUBING LAPAROSCOPIC (IRRIGATION / IRRIGATOR) IMPLANT
SET TUBE SMOKE EVAC HIGH FLOW (TUBING) ×4 IMPLANT
SHEARS FOC LG CVD HARMONIC 17C (MISCELLANEOUS) ×4 IMPLANT
SLEEVE ENDOPATH XCEL 5M (ENDOMECHANICALS) ×4 IMPLANT
SLEEVE SUCTION 125 (MISCELLANEOUS) ×4 IMPLANT
SLEEVE SUCTION CATH 165 (SLEEVE) ×4 IMPLANT
SPONGE INTESTINAL PEANUT (DISPOSABLE) IMPLANT
SPONGE LAP 18X18 RF (DISPOSABLE) ×20 IMPLANT
SPONGE SURGIFOAM ABS GEL 100 (HEMOSTASIS) IMPLANT
STAPLER GUN LINEAR PROX 60 (STAPLE) ×4 IMPLANT
STAPLER PROXIMATE 75MM BLUE (STAPLE) ×4 IMPLANT
STAPLER VISISTAT 35W (STAPLE) ×4 IMPLANT
SUCTION POOLE HANDLE (INSTRUMENTS) ×4
SUT 5.0 PDS RB-1 (SUTURE) ×10
SUT ETHILON 2 0 FS 18 (SUTURE) ×12 IMPLANT
SUT ETHILON 2 LR (SUTURE) IMPLANT
SUT MNCRL AB 4-0 PS2 18 (SUTURE) IMPLANT
SUT PDS 5 0 RB 1 (SUTURE) ×4 IMPLANT
SUT PDS AB 1 TP1 96 (SUTURE) ×8 IMPLANT
SUT PDS AB 3-0 SH 27 (SUTURE) ×20 IMPLANT
SUT PDS AB 4-0 RB1 27 (SUTURE) ×40 IMPLANT
SUT PDS PLUS AB 5-0 RB-1 (SUTURE) ×10 IMPLANT
SUT PROLENE 3 0 SH 48 (SUTURE) ×16 IMPLANT
SUT PROLENE 4 0 RB 1 (SUTURE) ×4
SUT PROLENE 4-0 RB1 .5 CRCL 36 (SUTURE) ×4 IMPLANT
SUT PROLENE 5 0 RB 1 DA (SUTURE) ×8 IMPLANT
SUT SILK 2 0 SH CR/8 (SUTURE) ×16 IMPLANT
SUT SILK 2 0 TIES 10X30 (SUTURE) ×4 IMPLANT
SUT SILK 3 0 SH CR/8 (SUTURE) ×4 IMPLANT
SUT SILK 3 0 TIES 10X30 (SUTURE) ×4 IMPLANT
SUT VIC AB 2-0 CT1 27 (SUTURE)
SUT VIC AB 2-0 CT1 TAPERPNT 27 (SUTURE) IMPLANT
SUT VIC AB 2-0 SH 18 (SUTURE) IMPLANT
SUT VIC AB 3-0 SH 18 (SUTURE) IMPLANT
SUT VIC AB 3-0 SH 27 (SUTURE)
SUT VIC AB 3-0 SH 27X BRD (SUTURE) IMPLANT
SUT VICRYL AB 2 0 TIES (SUTURE) IMPLANT
SYR BULB IRRIGATION 50ML (SYRINGE) ×4 IMPLANT
TAPE UMBILICAL 1/8 X36 TWILL (MISCELLANEOUS) IMPLANT
TOWEL GREEN STERILE (TOWEL DISPOSABLE) ×4 IMPLANT
TOWEL GREEN STERILE FF (TOWEL DISPOSABLE) ×4 IMPLANT
TRAY FOLEY SLVR 16FR LF STAT (SET/KITS/TRAYS/PACK) ×4 IMPLANT
TRAY LAPAROSCOPIC MC (CUSTOM PROCEDURE TRAY) ×4 IMPLANT
TROCAR XCEL BLUNT TIP 100MML (ENDOMECHANICALS) IMPLANT
TROCAR XCEL NON-BLD 11X100MML (ENDOMECHANICALS) IMPLANT
TROCAR XCEL NON-BLD 5MMX100MML (ENDOMECHANICALS) ×4 IMPLANT
TUBE CONNECTING 12'X1/4 (SUCTIONS) ×1
TUBE CONNECTING 12X1/4 (SUCTIONS) ×3 IMPLANT
TUBE FEEDING 8FR 16IN STR KANG (MISCELLANEOUS) IMPLANT
TUBE FEEDING ENTERAL 5FR 16IN (TUBING) IMPLANT
TUBE MOSS GAS 18FR (TUBING) ×4 IMPLANT
YANKAUER SUCT BULB TIP NO VENT (SUCTIONS) ×4 IMPLANT

## 2020-03-20 NOTE — Anesthesia Procedure Notes (Signed)
Procedure Name: Intubation Date/Time: 03/20/2020 8:04 AM Performed by: Trinna Post., CRNA Pre-anesthesia Checklist: Patient identified, Emergency Drugs available, Suction available, Patient being monitored and Timeout performed Patient Re-evaluated:Patient Re-evaluated prior to induction Oxygen Delivery Method: Circle system utilized Preoxygenation: Pre-oxygenation with 100% oxygen Induction Type: IV induction Ventilation: Mask ventilation without difficulty Laryngoscope Size: Glidescope and 4 Grade View: Grade I Tube type: Oral Tube size: 7.5 mm Number of attempts: 1 Airway Equipment and Method: Rigid stylet and Video-laryngoscopy Placement Confirmation: ETT inserted through vocal cords under direct vision,  positive ETCO2 and breath sounds checked- equal and bilateral Secured at: 23 cm Tube secured with: Tape Dental Injury: Teeth and Oropharynx as per pre-operative assessment  Difficulty Due To: Difficulty was unanticipated and Difficult Airway- due to anterior larynx Comments: DVL x2 with MAC 4 and Miller 2 with no view. Patient is easy mask with no desaturation, glidescope used with success.

## 2020-03-20 NOTE — Interval H&P Note (Signed)
History and Physical Interval Note:  03/20/2020 7:35 AM  Albert Hartman  has presented today for surgery, with the diagnosis of DUODENAL MASS.  The various methods of treatment have been discussed with the patient and family. After consideration of risks, benefits and other options for treatment, the patient has consented to  Procedure(s): LAPAROSCOPY DIAGNOSTIC, PARTIAL DUODENAL RESECTION VS WHIPPLE VS GASTROJEJUNOSTOMY WITH FEEDING TUBE (N/A) WHIPPLE PROCEDURE (N/A) as a surgical intervention.  The patient's history has been reviewed, patient examined, no change in status, stable for surgery.  I have reviewed the patient's chart and labs.  Questions were answered to the patient's satisfaction.     Stark Klein

## 2020-03-20 NOTE — Anesthesia Procedure Notes (Signed)
Arterial Line Insertion Start/End4/29/2021 7:20 AM Performed by: Verdie Drown, CRNA, CRNA  Patient location: Pre-op. Preanesthetic checklist: patient identified, IV checked, site marked, risks and benefits discussed, surgical consent, monitors and equipment checked, pre-op evaluation, timeout performed and anesthesia consent Lidocaine 1% used for infiltration Left, radial was placed Catheter size: 20 G Hand hygiene performed  and maximum sterile barriers used   Attempts: 2 Procedure performed without using ultrasound guided technique. Following insertion, dressing applied and Biopatch. Post procedure assessment: normal  Patient tolerated the procedure well with no immediate complications.

## 2020-03-20 NOTE — Op Note (Signed)
PRE-OPERATIVE DIAGNOSIS: duodenal mass  POST-OPERATIVE DIAGNOSIS:  Same  PROCEDURE:  Procedure(s): Diagnostic laparoscopy, resection of 3rd, 4th portion of duodenum, gastrojejunostomy, GJ tube, pyloroplasty  SURGEON:  Surgeon(s): Stark Klein, MD  ASSISTANT: Christie Beckers, MD Judyann Munson, RNFA  ANESTHESIA:   general  DRAINS: (67 Fr) Blake drain(s) in the LUQ near the ligament of treitz and one in the RUQ and 18 Fr GJ tube   LOCAL MEDICATIONS USED:  NONE  SPECIMEN:  Source of Specimen:  3/4th portion of duodenum, mesenteric nodes, omentum  DISPOSITION OF SPECIMEN:  PATHOLOGY  COUNTS:  YES  DICTATION: .Dragon Dictation  PLAN OF CARE: Admit to inpatient   PATIENT DISPOSITION:  PACU - hemodynamically stable.  FINDINGS:  Significant mesenteric adenopathy at ligament of treitz and along SMA.  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  Tattoo seen at site of stricture.  No specific mucosal lesion.    EBL: 250  PROCEDURE:  Patient was identified in the holding area and taken to the operating room where he was placed supine on the operating room table.  General anesthesia was induced.  A Foley catheter was placed.  His abdomen was then prepped and draped in sterile fashion.  The timeout was performed according to the surgical safety checklist.  When all was correct, we continued.  The patient was rotated to the right, placed into reverse Trendelenburg position, and the left costal margin was anesthetized with local.  A 7 mm incision was made with a #11 blade and an Optiview port and camera were used to access the abdomen.  An additional port was placed in the midline and the surfaces of the abdomen were examined.  No evidence of carcinomatosis was seen.  A midline incision was made with a #10 blade.  The subcutaneous tissues were divided with cautery.  The fascia was entered sharply.  The peritoneum was elevated and entered sharply as well.  The fascial incision was carried  out to the length of the skin incision with the cautery.  There was an umbilical hernia defect that was incarcerated with omentum.  This was reduced and the opened.  Bookwalter retractor was placed to assist with visualization.  The lesser sac was entered by taking the omentum off of the transverse colon.  This was taken down the whole length of the transverse colon.  The colon was then elevated and the ligament of Treitz evaluated.  The tattoo was seen right at the ligament of Treitz.  The small bowel was divided around 10 cm beyond the ligament of Treitz with the GIA-75 stapler.  Distal portion was oversewn with a 3-0 PDS.  The harmonic scalpel as well was used to take multiple enlarged lymph nodes from the mesenteric vessels at the ligament of Treitz carefully.  The Overholt clamp was used to dissect down near this area in order to free up the LOT.  Several bridging veins were clamped, divided, and suture ligated.    The TA 60 stapler was used to divide the third portion of the duodenum.  The harmonic scalpel was used to complete the dissection away from the mesenteric vessels.  There were definitely some enlarged lymph nodes that were remaining, however these extended deep down into the mesentery and a full resection would have required at least a third of the small bowel to be removed and could have compromised the remaining blood supply.  The specimen was then passed off.  The abdomen was irrigated.  The jejunum was then pulled up  in an antecolic fashion and a stapled anastomosis was created on the retrocolic location.  The apex of the anastomosis was closed with pressure was reinforced with 3-0 silk.  The defect was closed with 2 running 3-0 PDS sutures in [place] fashion.  A GJ tube was placed in the anterior stomach and Stamm fashion with 2 pursestring sutures.  It was secured to the abdominal wall with 2 oh silks.  The enteric portion of the tube was passed into the jejunal limb of the  anastomosis.  A pyloroplasty was created in order to allow passage of bile retrograde into the stomach.  The stay sutures were placed on either side of the pylorus and the cautery was used to open the pylorus.  This was then closed in a transverse fashion.  No evidence of leakage was seen.  Omentum was secured over the top of the pyloroplasty.  [place] drains were placed in the left upper quadrant and the right upper quadrant.  The left-sided one was placed near the ligament of Treitz where dissection near the pancreas was performed. The right one was near the pyloroplasty.  These were secured with 2-0 nylon.    Lap count was performed and was correct.  The fascia was then closed using running #1 looped PDS suture x2.  Skin was irrigated and closed with skin staples.  The patient was then allowed to emerge from anesthesia and taken to the PACU in stable condition.  Needle, sponge, and instrument counts were correct x2.

## 2020-03-20 NOTE — Anesthesia Procedure Notes (Signed)
Epidural  Start time: 03/20/2020 7:30 AM End time: 03/20/2020 7:42 AM  Staffing Anesthesiologist: Nolon Nations, MD  Preanesthetic Checklist Completed: patient identified, IV checked, risks and benefits discussed, surgical consent, monitors and equipment checked, pre-op evaluation and timeout performed  Epidural Prep: DuraPrep, site prepped and draped and Full Sterile technique. Gown/glove, drarpe, mask Patient monitoring: heart rate, cardiac monitor, continuous pulse ox and blood pressure Approach: right paramedian Location: thoracic (1-12) (T8-9) Injection technique: LOR air and LOR saline  Needle:  Needle type: Tuohy  Needle gauge: 17 G Needle length: 9 cm Needle insertion depth: 5 cm Catheter type: closed end flexible Catheter size: 19 Gauge Catheter at skin depth: 11 cm Test dose: negative and 1.5% lidocaine with Epi 1:200 K  Assessment Events: blood not aspirated, injection not painful, no injection resistance, no paresthesia and negative IV test

## 2020-03-20 NOTE — Transfer of Care (Signed)
Immediate Anesthesia Transfer of Care Note  Patient: Albert Hartman  Procedure(s) Performed: LAPAROSCOPY DIAGNOSTIC (N/A Abdomen) Resection of Third and Fourth Portions of Duodenum (N/A Abdomen) Pyloroplasty (N/A Abdomen) Gastrojejunostomy (N/A Abdomen) Insertion Of Gastrojejunostomy Tube (N/A Abdomen)  Patient Location: PACU  Anesthesia Type:General and Epidural  Level of Consciousness: drowsy  Airway & Oxygen Therapy: Patient Spontanous Breathing and Patient connected to face mask oxygen  Post-op Assessment: Report given to RN and Post -op Vital signs reviewed and stable  Post vital signs: Reviewed and stable  Last Vitals:  Vitals Value Taken Time  BP 105/51 03/20/20 1238  Temp    Pulse 85 03/20/20 1239  Resp 18 03/20/20 1239  SpO2 100 % 03/20/20 1239  Vitals shown include unvalidated device data.  Last Pain:  Vitals:   03/20/20 0608  PainSc: 0-No pain      Patients Stated Pain Goal: 4 ([id number])  Complications: No apparent anesthesia complications

## 2020-03-21 ENCOUNTER — Inpatient Hospital Stay (HOSPITAL_COMMUNITY): Payer: Medicare Other | Admitting: Anesthesiology

## 2020-03-21 LAB — PROTIME-INR
INR: 1.2 (ref 0.8–1.2)
Prothrombin Time: 14.6 seconds (ref 11.4–15.2)

## 2020-03-21 LAB — CBC
HCT: 30.7 % — ABNORMAL LOW (ref 39.0–52.0)
Hemoglobin: 10.3 g/dL — ABNORMAL LOW (ref 13.0–17.0)
MCH: 30.6 pg (ref 26.0–34.0)
MCHC: 33.6 g/dL (ref 30.0–36.0)
MCV: 91.1 fL (ref 80.0–100.0)
Platelets: 260 10*3/uL (ref 150–400)
RBC: 3.37 MIL/uL — ABNORMAL LOW (ref 4.22–5.81)
RDW: 16.1 % — ABNORMAL HIGH (ref 11.5–15.5)
WBC: 13.7 10*3/uL — ABNORMAL HIGH (ref 4.0–10.5)
nRBC: 0 % (ref 0.0–0.2)

## 2020-03-21 LAB — COMPREHENSIVE METABOLIC PANEL
ALT: 15 U/L (ref 0–44)
AST: 22 U/L (ref 15–41)
Albumin: 2.3 g/dL — ABNORMAL LOW (ref 3.5–5.0)
Alkaline Phosphatase: 63 U/L (ref 38–126)
Anion gap: 8 (ref 5–15)
BUN: 5 mg/dL — ABNORMAL LOW (ref 8–23)
CO2: 22 mmol/L (ref 22–32)
Calcium: 8.3 mg/dL — ABNORMAL LOW (ref 8.9–10.3)
Chloride: 98 mmol/L (ref 98–111)
Creatinine, Ser: 0.58 mg/dL — ABNORMAL LOW (ref 0.61–1.24)
GFR calc Af Amer: >60 mL/min (ref >60)
GFR calc non Af Amer: >60 mL/min (ref >60)
Glucose, Bld: 125 mg/dL — ABNORMAL HIGH (ref 70–99)
Potassium: 3.9 mmol/L (ref 3.5–5.1)
Sodium: 128 mmol/L — ABNORMAL LOW (ref 135–145)
Total Bilirubin: 1.2 mg/dL (ref 0.3–1.2)
Total Protein: 4.5 g/dL — ABNORMAL LOW (ref 6.5–8.1)

## 2020-03-21 LAB — MAGNESIUM: Magnesium: 1.4 mg/dL — ABNORMAL LOW (ref 1.7–2.4)

## 2020-03-21 LAB — PHOSPHORUS: Phosphorus: 3.2 mg/dL (ref 2.5–4.6)

## 2020-03-21 MED ORDER — ALBUMIN HUMAN 25 % IV SOLN
25.0000 g | Freq: Four times a day (QID) | INTRAVENOUS | Status: AC
  Administered 2020-03-21 – 2020-03-23 (×8): 25 g via INTRAVENOUS
  Filled 2020-03-21 (×7): qty 100

## 2020-03-21 MED ORDER — MAGNESIUM SULFATE 2 GM/50ML IV SOLN
2.0000 g | Freq: Once | INTRAVENOUS | Status: AC
  Administered 2020-03-21: 2 g via INTRAVENOUS
  Filled 2020-03-21: qty 50

## 2020-03-21 MED ORDER — DEXTROSE-NACL 5-0.9 % IV SOLN: INTRAVENOUS | Status: DC

## 2020-03-21 MED ORDER — SODIUM PHOSPHATES 45 MMOLE/15ML IV SOLN
30.0000 mmol | Freq: Once | INTRAVENOUS | Status: AC
  Administered 2020-03-21: 30 mmol via INTRAVENOUS
  Filled 2020-03-21: qty 10

## 2020-03-21 NOTE — Progress Notes (Signed)
Initial Nutrition Assessment  DOCUMENTATION CODES:   Not applicable  INTERVENTION:   Supplement diet once advanced  Once ready to use feeding tube recommend Osmolite 1.5 start @ 20 ml/hr with goal rate of 60 ml/hr and 30 ml Prostat BID  Provides: 2360 kcal, 120 grams protein, and 1094 ml free water.    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  GOAL:   Patient will meet greater than or equal to 90% of their needs  MONITOR:   I & O's  REASON FOR ASSESSMENT:   Rounds    ASSESSMENT:   Pt with PMH of HTN and GERD who was dx with duodenal mass/stricture now admitted for surgery now s/p resection of 3rd, 4th portion of duodenum, gastrojejunostomy, GJ tube placement.   Pt discussed during ICU rounds and with RN.   Spoke with pt, wife, and pt's sister. Per pt/family pt began vomiting after eating the day after Christmas (4 months ago) and was unable to eat solid foods. Pt lost 53-56 lb during this time. (17% weight loss in 4 months)  Pt has been drinking full/clear liquids for the last 6 weeks. During this time pt was very motivated and was drinking a lot of high calorie beverages/foods. He was drinking OJ, coke, vegetable juice, smoothies, protein shakes and was able to regain 3 pounds.   Medications reviewed and include:  D5 NS @ 100 ml/hr Mag sulfate x 1 IV Na phos x 1 IV  Labs reviewed: Na 128 (L), magnesium: 1.4 (L)  Drain 1: 110 ml Drain 2: 165 ml G/J tube: 300 ml  NUTRITION - FOCUSED PHYSICAL EXAM:    Most Recent Value  Orbital Region  No depletion  Upper Arm Region  No depletion  Thoracic and Lumbar Region  No depletion  Buccal Region  No depletion  Temple Region  No depletion  Clavicle Bone Region  Mild depletion  Clavicle and Acromion Bone Region  Mild depletion  Scapular Bone Region  Unable to assess  Patellar  Region  No depletion  Anterior Thigh Region  No depletion  Posterior Calf Region  No depletion  Edema (RD Assessment)  None  Hair  Reviewed  Eyes  Reviewed  Mouth  Reviewed  Skin  Reviewed  Nails  Reviewed       Diet Order:   Diet Order            Diet NPO time specified  Diet effective now              EDUCATION NEEDS:   No education needs have been identified at this time  Skin:  Skin Assessment: Reviewed RN Assessment  Last BM:  unknown  Height:   Ht Readings from Last 1 Encounters:  03/20/20 6' 1.5" (1.867 m)    Weight:   Wt Readings from Last 1 Encounters:  03/20/20 98.3 kg    Ideal Body Weight:  86.3 kg  BMI:  Body mass index is 28.2 kg/m.  Estimated Nutritional Needs:   Kcal:  2300-2500  Protein:  120-145 grams  Fluid:  >2 L/day  Lockie Pares., RD, LDN, CNSC See AMiON for contact information

## 2020-03-21 NOTE — Evaluation (Signed)
Occupational Therapy Evaluation Patient Details Name: Albert Hartman MRN: [id number] DOB: 07/23/1946 Today's Date: 03/21/2020    History of Present Illness Pt is a 74 yo M referred by Dr. Penelope Coop for duodenal obstruction. The patient presented with around a 50-70 lb weight loss since christmas.  He doesn't have much of an appetite.  He has periodically had severe episodes of throwing up. 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 Pt underwent Diagnostic laparoscopy, resection of 3rd, 4th portion of duodenum, gastrojejunostomy, GJ tube, pyloroplasty on 4/29. PMH includes HTN, sleep apnea, reflux.   Clinical Impression   PTA, pt was living with his wife and mother and was independent. Pt currently requiring Min-Mod A for LB ADLs and Min A for functional transfers. Pt presenting with decreased activity tolerance due to pain. Motivated to participate in therapy despite pain. Feel pt will progress well with time. Pt would benefit from further acute OT to facilitate safe dc. Recommend dc to home once medically stable per physician.     Follow Up Recommendations  No OT follow up;Supervision - Intermittent    Equipment Recommendations  None recommended by OT    Recommendations for Other Services PT consult     Precautions / Restrictions Precautions Precautions: Fall Precaution Comments: multiple drains, G tube Restrictions Weight Bearing Restrictions: No      Mobility Bed Mobility Overal bed mobility: Needs Assistance Bed Mobility: Supine to Sit     Supine to sit: Min assist;HOB elevated     General bed mobility comments: Min A for assistance with trunk  Transfers Overall transfer level: Needs assistance Equipment used: None Transfers: Sit to/from Stand Sit to Stand: Min assist         General transfer comment: Min A to steady once in standing    Balance Overall balance assessment: Needs assistance Sitting-balance support: No upper extremity  supported;Feet supported Sitting balance-Leahy Scale: Good Sitting balance - Comments: close supervision   Standing balance support: No upper extremity supported Standing balance-Leahy Scale: Fair Standing balance comment: minG-minA, slight anterior lean initially, pt able to self-correct                           ADL either performed or assessed with clinical judgement   ADL Overall ADL's : Needs assistance/impaired Eating/Feeding: NPO   Grooming: Set up;Sitting   Upper Body Bathing: Minimal assistance;Sitting   Lower Body Bathing: Minimal assistance;Sit to/from stand   Upper Body Dressing : Minimal assistance;Sitting   Lower Body Dressing: Moderate assistance;Sit to/from stand   Toilet Transfer: Minimal assistance;+2 for safety/equipment(simulated to recliner) Toilet Transfer Details (indicate cue type and reason): Min A to steady once in standing         Functional mobility during ADLs: Min guard;+2 for safety/equipment General ADL Comments: Pt demonstrating decreased activity tolerance due to pain. Very motivated     Vision Baseline Vision/History: Wears glasses Wears Glasses: At all times Patient Visual Report: No change from baseline       Perception     Praxis      Pertinent Vitals/Pain Pain Assessment: Faces Faces Pain Scale: Hurts whole lot Pain Location: abdomen Pain Descriptors / Indicators: Grimacing Pain Intervention(s): Monitored during session;Limited activity within patient's tolerance;Repositioned     Hand Dominance     Extremity/Trunk Assessment Upper Extremity Assessment Upper Extremity Assessment: Overall WFL for tasks assessed   Lower Extremity Assessment Lower Extremity Assessment: Defer to PT evaluation LLE Deficits /  Details: tingling in left foot, 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   Cervical / Trunk Assessment Cervical / Trunk Assessment: Normal   Communication  Communication Communication: No difficulties   Cognition Arousal/Alertness: Awake/alert Behavior During Therapy: WFL for tasks assessed/performed Overall Cognitive Status: Within Functional Limits for tasks assessed                                     General Comments  VSS on RA, pt utilizing PCA as needed    Exercises     Shoulder Instructions      Home Living Family/patient expects to be discharged to:: Private residence Living Arrangements: Spouse/significant other;Parent(95 yo mother) Available Help at Discharge: Family;Available 24 hours/day Type of Home: House Home Access: Level entry     Home Layout: One level     Bathroom Shower/Tub: Walk-in shower;Tub/shower unit   Bathroom Toilet: Handicapped height     Home Equipment: Shower seat;Grab bars - toilet;Hand held shower head          Prior Functioning/Environment Level of Independence: Independent                 OT Problem List: Decreased strength;Decreased range of motion;Decreased activity tolerance;Impaired balance (sitting and/or standing);Decreased knowledge of use of DME or AE;Decreased knowledge of precautions;Pain      OT Treatment/Interventions: Self-care/ADL training;Therapeutic exercise;Energy conservation;DME and/or AE instruction;Therapeutic activities;Patient/family education    OT Goals(Current goals can be found in the care plan section) Acute Rehab OT Goals Patient Stated Goal: To return to independence OT Goal Formulation: With patient Time For Goal Achievement: 04/04/20 Potential to Achieve Goals: Good  OT Frequency: Min 3X/week   Barriers to D/C:            Co-evaluation              AM-PAC OT "6 Clicks" Daily Activity     Outcome Measure Help from another person eating meals?: Total Help from another person taking care of personal grooming?: A Little Help from another person toileting, which includes using toliet, bedpan, or urinal?: A Little Help  from another person bathing (including washing, rinsing, drying)?: A Little Help from another person to put on and taking off regular upper body clothing?: A Little Help from another person to put on and taking off regular lower body clothing?: A Lot 6 Click Score: 15   End of Session Nurse Communication: Mobility status  Activity Tolerance: Patient tolerated treatment well Patient left: in chair;with call bell/phone within reach;with chair alarm set;with family/visitor present  OT Visit Diagnosis: Unsteadiness on feet (R26.81);Other abnormalities of gait and mobility (R26.89);Muscle weakness (generalized) (M62.81);Pain Pain - part of body: (Abdomen)                Time: [id number] OT Time Calculation (min): 21 min Charges:  OT General Charges $OT Visit: 1 Visit OT Evaluation $OT Eval Moderate Complexity: Rhodell, OTR/L Acute Rehab Pager: [phone] Office: [place] 03/21/2020, 12:17 PM

## 2020-03-21 NOTE — Anesthesia Postprocedure Evaluation (Signed)
Anesthesia Post Note  Patient: Albert Hartman  Procedure(s) Performed: LAPAROSCOPY DIAGNOSTIC (N/A Abdomen) Resection of Third and Fourth Portions of Duodenum (N/A Abdomen) Pyloroplasty (N/A Abdomen) Gastrojejunostomy (N/A Abdomen) Insertion Of Gastrojejunostomy Tube (N/A Abdomen)     Patient location during evaluation: PACU Anesthesia Type: Epidural and General Level of consciousness: sedated and patient cooperative Pain management: pain level controlled Vital Signs Assessment: post-procedure vital signs reviewed and stable Respiratory status: spontaneous breathing Cardiovascular status: stable Anesthetic complications: no    Last Vitals:  Vitals:   03/21/20 1700 03/21/20 1736  BP: 117/62   Pulse: 69   Resp: 13 18  Temp:    SpO2: 95% 95%    Last Pain:  Vitals:   03/21/20 1736  TempSrc:   PainSc: [place]

## 2020-03-21 NOTE — Anesthesia Post-op Follow-up Note (Signed)
  Anesthesia Pain Follow-up Note  Patient: Albert Hartman  Day #: POD #1  Date of Follow-up: 03/21/2020 Time: 10:30 AM  Last Vitals:  Vitals:   03/21/20 0946 03/21/20 1000  BP: 109/61 119/61  Pulse: 68 73  Resp: 16 14  Temp:    SpO2: 99% 99%    Level of Consciousness: alert  Pain: mild   Side Effects:None  Catheter Site Exam:clean, dry, no drainage  Epidural / Intrathecal (From admission, onward)   Start     Dose/Rate Route Frequency Ordered Stop   03/20/20 1330  ropivacaine (PF) 2 mg/mL (0.2%) (NAROPIN) injection     6-10 mL/hr 6-10 mL/hr  Epidural Continuous 03/20/20 1328         Plan: Continue current therapy of postop epidural at surgeon's request  Jordi Kamm DANIEL

## 2020-03-21 NOTE — Progress Notes (Signed)
Chaplain engaged in initial visit with Albert Hartman and his wife.  During visit, [name] shared his health journey with chaplain.  [name] noted that he has received an outpouring of prayers and love concerning his health.  [name] and his wife have a realistic optimism and are grateful for the progress and results they have received at this time.   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  He was able to go into work that allowed him to utilize his degree and be there for others.    Chaplain offered support, presence and listening.  Chaplain will follow-up.

## 2020-03-21 NOTE — Progress Notes (Signed)
1 Day Post-Op   Subjective/Chief Complaint: "A little sore in the upper abdomen."  Had a few episodes of hiccups.     Objective: Vital signs in last 24 hours: Temp:  [97 F (36.1 C)-98.3 F (36.8 C)] 98.3 F (36.8 C) (04/30 0800) Pulse Rate:  [64-99] 84 (04/30 0800) Resp:  [8-31] 17 (04/30 0800) BP: (78-154)/(45-80) 107/59 (04/30 0800) SpO2:  [95 %-100 %] 98 % (04/30 0800) Arterial Line BP: (48-131)/(35-103) 94/55 (04/30 0800) FiO2 (%):  [21 %] 21 % (04/30 0759) Last BM Date: (pta)  Intake/Output from previous day: 04/29 0701 - 04/30 0700 In: 4378.1 [I.V.:2898.7; IV Piggyback:1100.1] Out: 1850 [Urine:1025; Drains:575; Blood:250] Intake/Output this shift: Total I/O In: 274.9 [I.V.:174.9; IV Piggyback:100] Out: -   General appearance: alert, cooperative and no distress Resp: breathing comfortably GI: soft, non distended, approp tender.  dressing c/d/i.  drains serosang.  G tube to gravity wtih bilious output. Extremities: extremities normal, atraumatic, no cyanosis or edema  Lab Results:  Recent Labs    03/21/20 0402  WBC 13.7*  HGB 10.3*  HCT 30.7*  PLT 260   BMET Recent Labs    03/21/20 0402  NA 128*  K 3.9  CL 98  CO2 22  GLUCOSE 125*  BUN 5*  CREATININE 0.58*  CALCIUM 8.3*   PT/INR Recent Labs    03/21/20 0402  LABPROT 14.6  INR 1.2   ABG No results for input(s): PHART, HCO3 in the last 72 hours.  Invalid input(s): PCO2, PO2  Studies/Results: No results found.  Anti-infectives: Anti-infectives (From admission, onward)   Start     Dose/Rate Route Frequency Ordered Stop   03/20/20 1900  ciprofloxacin (CIPRO) IVPB 400 mg     400 mg 200 mL/hr over 60 Minutes Intravenous Every 12 hours 03/20/20 1829 03/20/20 2136   03/20/20 0600  ciprofloxacin (CIPRO) IVPB 400 mg     400 mg 200 mL/hr over 60 Minutes Intravenous On call to O.R. 03/20/20 0551 03/20/20 0807      Assessment/Plan: s/p Procedure(s): LAPAROSCOPY DIAGNOSTIC (N/A) Resection of  Third and Fourth Portions of Duodenum (N/A) Pyloroplasty (N/A) Gastrojejunostomy (N/A) Insertion Of Gastrojejunostomy Tube (N/A)   Duodenal obstruction and lymphadenopathy, await pathology Add 25% albumin boluses.   Epidural/pca for pain control stay NPO  ABL anemia - monitor Hyponatremia and hypophosphatemia - replete phos and switch to NS from 1/2 NS   LOS: 1 day        Stark Klein 03/21/2020

## 2020-03-21 NOTE — Evaluation (Signed)
Physical Therapy Evaluation Patient Details Name: Albert Hartman MRN: [id number] DOB: August 03, 1946 Today's Date: 03/21/2020   History of Present Illness  Pt is a 74 yo M referred by Dr. Penelope Coop for duodenal obstruction. The patient presented with around a 50-70 lb weight loss since christmas.  He doesn't have much of an appetite.  He has periodically had severe episodes of throwing up. 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 Pt underwent Diagnostic laparoscopy, resection of 3rd, 4th portion of duodenum, gastrojejunostomy, GJ tube, pyloroplasty on 4/29. PMH includes HTN, sleep apnea, reflux.  Clinical Impression  Pt presents to PT with deficits in activity tolerance, balance, gait, functional mobility, power, strength. Pt is slightly limited by abdominal pain, mobilizing slowly and cautiously during session. Pt requires some physical assistance to perform bed mobility and to initially stabilize once standing. Pt will benefit from aggressive mobilization and acute PT POC to restore independent mobility. Pt is encouraged to begin ambulating to bathroom to urinate after foley removal with staff assistance and to progress mobility to out of room as able with staff.    Follow Up Recommendations Home health PT;Supervision - Intermittent    Equipment Recommendations  None recommended by PT(pt owns necessary DME)    Recommendations for Other Services       Precautions / Restrictions Precautions Precautions: Fall Precaution Comments: multiple drains, G tube Restrictions Weight Bearing Restrictions: No      Mobility  Bed Mobility Overal bed mobility: Needs Assistance Bed Mobility: Supine to Sit     Supine to sit: Min assist;HOB elevated        Transfers Overall transfer level: Needs assistance Equipment used: None Transfers: Sit to/from Stand Sit to Stand: Min assist            Ambulation/Gait Ambulation/Gait assistance: Min guard Gait Distance  (Feet): 7 Feet Assistive device: 1 person hand held assist Gait Pattern/deviations: Step-to pattern Gait velocity: reduced Gait velocity interpretation: <1.8 ft/sec, indicate of risk for recurrent falls General Gait Details: pt with slight increase in lateral sway, utilizing hand hold to steady but no significant support provided.   Stairs            Wheelchair Mobility    Modified Rankin (Stroke Patients Only)       Balance Overall balance assessment: Needs assistance Sitting-balance support: No upper extremity supported;Feet supported Sitting balance-Leahy Scale: Good Sitting balance - Comments: close supervision   Standing balance support: No upper extremity supported Standing balance-Leahy Scale: Fair Standing balance comment: minG-minA, slight anterior lean initially, pt able to self-correct                             Pertinent Vitals/Pain Pain Assessment: Faces Faces Pain Scale: Hurts whole lot Pain Location: abdomen Pain Descriptors / Indicators: Grimacing Pain Intervention(s): PCA encouraged    Home Living Family/patient expects to be discharged to:: Private residence Living Arrangements: Spouse/significant other Available Help at Discharge: Family;Available 24 hours/day Type of Home: House Home Access: Level entry     Home Layout: One level Home Equipment: Shower seat;Grab bars - toilet;Hand held shower head      Prior Function Level of Independence: Independent               Hand Dominance        Extremity/Trunk Assessment   Upper Extremity Assessment Upper Extremity Assessment: Overall WFL for tasks assessed    Lower Extremity Assessment Lower Extremity  Assessment: Generalized weakness;LLE deficits/detail LLE Deficits / Details: tingling in left foot, 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    Cervical / Trunk Assessment Cervical / Trunk Assessment: Normal  Communication   Communication:  No difficulties  Cognition Arousal/Alertness: Awake/alert Behavior During Therapy: WFL for tasks assessed/performed Overall Cognitive Status: Within Functional Limits for tasks assessed                                        General Comments General comments (skin integrity, edema, etc.): VSS on RA, pt utilizing PCA as needed    Exercises     Assessment/Plan    PT Assessment Patient needs continued PT services  PT Problem List Pain;Decreased strength;Decreased activity tolerance;Decreased balance;Decreased mobility;Decreased knowledge of use of DME       PT Treatment Interventions DME instruction;Gait training;Stair training;Functional mobility training;Therapeutic activities;Therapeutic exercise;Balance training;Neuromuscular re-education;Patient/family education    PT Goals (Current goals can be found in the Care Plan section)  Acute Rehab PT Goals Patient Stated Goal: To return to independence PT Goal Formulation: With patient Time For Goal Achievement: 04/04/20 Potential to Achieve Goals: Good Additional Goals Additional Goal #1: Pt will maintain dynamic standing balance within 10 inches of his base of support without UE support and supervision    Frequency Min 3X/week   Barriers to discharge        Co-evaluation               AM-PAC PT "6 Clicks" Mobility  Outcome Measure Help needed turning from your back to your side while in a flat bed without using bedrails?: A Little Help needed moving from lying on your back to sitting on the side of a flat bed without using bedrails?: A Little Help needed moving to and from a bed to a chair (including a wheelchair)?: A Little Help needed standing up from a chair using your arms (e.g., wheelchair or bedside chair)?: A Little Help needed to walk in hospital room?: A Little Help needed climbing 3-5 steps with a railing? : A Lot 6 Click Score: 17    End of Session   Activity Tolerance: Patient tolerated  treatment well Patient left: in chair;with call bell/phone within reach;with chair alarm set;with family/visitor present Nurse Communication: Mobility status PT Visit Diagnosis: Other abnormalities of gait and mobility (R26.89);Pain;Muscle weakness (generalized) (M62.81) Pain - Right/Left: (abdomen) Pain - part of body: (abdomen)    Time: [id number] PT Time Calculation (min) (ACUTE ONLY): 23 min   Charges:   PT Evaluation $PT Eval Moderate Complexity: 1 Mod          Zenaida Niece, PT, DPT Acute Rehabilitation Pager: [phone]   Zenaida Niece 03/21/2020, 10:45 AM

## 2020-03-22 LAB — CBC
HCT: 27.9 % — ABNORMAL LOW (ref 39.0–52.0)
Hemoglobin: 9.2 g/dL — ABNORMAL LOW (ref 13.0–17.0)
MCH: 29.9 pg (ref 26.0–34.0)
MCHC: 33 g/dL (ref 30.0–36.0)
MCV: 90.6 fL (ref 80.0–100.0)
Platelets: 262 10*3/uL (ref 150–400)
RBC: 3.08 MIL/uL — ABNORMAL LOW (ref 4.22–5.81)
RDW: 16.7 % — ABNORMAL HIGH (ref 11.5–15.5)
WBC: 9.5 10*3/uL (ref 4.0–10.5)
nRBC: 0 % (ref 0.0–0.2)

## 2020-03-22 LAB — COMPREHENSIVE METABOLIC PANEL
ALT: 12 U/L (ref 0–44)
AST: 16 U/L (ref 15–41)
Albumin: 2.7 g/dL — ABNORMAL LOW (ref 3.5–5.0)
Alkaline Phosphatase: 58 U/L (ref 38–126)
Anion gap: 7 (ref 5–15)
BUN: <5 mg/dL — ABNORMAL LOW (ref 8–23)
CO2: 25 mmol/L (ref 22–32)
Calcium: 8.5 mg/dL — ABNORMAL LOW (ref 8.9–10.3)
Chloride: 104 mmol/L (ref 98–111)
Creatinine, Ser: 0.58 mg/dL — ABNORMAL LOW (ref 0.61–1.24)
GFR calc Af Amer: >60 mL/min (ref >60)
GFR calc non Af Amer: >60 mL/min (ref >60)
Glucose, Bld: 96 mg/dL (ref 70–99)
Potassium: 3.5 mmol/L (ref 3.5–5.1)
Sodium: 136 mmol/L (ref 135–145)
Total Bilirubin: 0.5 mg/dL (ref 0.3–1.2)
Total Protein: 4.8 g/dL — ABNORMAL LOW (ref 6.5–8.1)

## 2020-03-22 LAB — MAGNESIUM: Magnesium: 1.5 mg/dL — ABNORMAL LOW (ref 1.7–2.4)

## 2020-03-22 LAB — PHOSPHORUS: Phosphorus: 3.1 mg/dL (ref 2.5–4.6)

## 2020-03-22 MED ORDER — MAGNESIUM SULFATE 4 GM/100ML IV SOLN
4.0000 g | Freq: Once | INTRAVENOUS | Status: AC
  Administered 2020-03-22: 4 g via INTRAVENOUS
  Filled 2020-03-22: qty 100

## 2020-03-22 MED ORDER — POTASSIUM CHLORIDE 10 MEQ/100ML IV SOLN
10.0000 meq | INTRAVENOUS | Status: AC
  Administered 2020-03-22 (×5): 10 meq via INTRAVENOUS
  Filled 2020-03-22 (×5): qty 100

## 2020-03-22 NOTE — Progress Notes (Signed)
General Surgery Follow Up Note  Subjective:    Overnight Issues:   Objective:  Vital signs for last 24 hours: Temp:  [97.5 F (36.4 C)-99.2 F (37.3 C)] 97.5 F (36.4 C) (05/01 1200) Pulse Rate:  [68-108] 108 (05/01 1000) Resp:  [12-22] 16 (05/01 1000) BP: (109-137)/(58-74) 127/62 (05/01 1000) SpO2:  [91 %-99 %] 97 % (05/01 1000) Arterial Line BP: (102-177)/(56-85) 124/82 (05/01 1100) FiO2 (%):  [21 %] 21 % (04/30 1736)  Hemodynamic parameters for last 24 hours:    Intake/Output from previous day: 04/30 0701 - 05/01 0700 In: 3364.4 [I.V.:2390.8; IV Piggyback:773.6] Out: 4190 [Urine:2825; Drains:1365]  Intake/Output this shift: Total I/O In: 100 [I.V.:100] Out: -   Vent settings for last 24 hours: FiO2 (%):  [21 %] 21 %  Physical Exam:  Gen: comfortable, no distress Neuro: non-focal exam HEENT: PERRL Neck: supple CV: RRR Pulm: unlabored breathing Abd: soft, appropriately TTP, incision c/d/i with staples, JP x2 with SS o/p--1.3L, GJ to gravity, tolerating ice chips, no flatus GU: clear yellow urine Extr: wwp, no edema   Results for orders placed or performed during the hospital encounter of 03/20/20 (from the past 24 hour(s))  Comprehensive metabolic panel     Status: Abnormal   Collection Time: 03/22/20  4:58 AM  Result Value Ref Range   Sodium 136 135 - 145 mmol/L   Potassium 3.5 3.5 - 5.1 mmol/L   Chloride 104 98 - 111 mmol/L   CO2 25 22 - 32 mmol/L   Glucose, Bld 96 70 - 99 mg/dL   BUN <5 (L) 8 - 23 mg/dL   Creatinine, Ser 0.58 (L) 0.61 - 1.24 mg/dL   Calcium 8.5 (L) 8.9 - 10.3 mg/dL   Total Protein 4.8 (L) 6.5 - 8.1 g/dL   Albumin 2.7 (L) 3.5 - 5.0 g/dL   AST 16 15 - 41 U/L   ALT 12 0 - 44 U/L   Alkaline Phosphatase 58 38 - 126 U/L   Total Bilirubin 0.5 0.3 - 1.2 mg/dL   GFR calc non Af Amer >60 >60 mL/min   GFR calc Af Amer >60 >60 mL/min   Anion gap 7 5 - 15  Magnesium     Status: Abnormal   Collection Time: 03/22/20  4:58 AM  Result Value  Ref Range   Magnesium 1.5 (L) 1.7 - 2.4 mg/dL  Phosphorus     Status: None   Collection Time: 03/22/20  4:58 AM  Result Value Ref Range   Phosphorus 3.1 2.5 - 4.6 mg/dL  CBC     Status: Abnormal   Collection Time: 03/22/20  4:58 AM  Result Value Ref Range   WBC 9.5 4.0 - 10.5 K/uL   RBC 3.08 (L) 4.22 - 5.81 MIL/uL   Hemoglobin 9.2 (L) 13.0 - 17.0 g/dL   HCT 27.9 (L) 39.0 - 52.0 %   MCV 90.6 80.0 - 100.0 fL   MCH 29.9 26.0 - 34.0 pg   MCHC 33.0 30.0 - 36.0 g/dL   RDW 16.7 (H) 11.5 - 15.5 %   Platelets 262 150 - 400 K/uL   nRBC 0.0 0.0 - 0.2 %    Assessment & Plan:  Present on Admission: . 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   and I reviewed the patients new imaging test results.    s/p Procedure(s): LAPAROSCOPY DIAGNOSTIC (N/A) Resection of Third and Fourth Portions  of Duodenum (N/A) Pyloroplasty (N/A) Gastrojejunostomy (N/A) Insertion Of Gastrojejunostomy Tube (N/A)   Duodenal obstruction and lymphadenopathy, await pathology Add 25% albumin boluses.   Epidural/pca for pain control stay NPO, maybe start TF tomorrow ABL anemia - monitor Hypomagnesemia and hypokalemia - replete  AMBULATE   Jesusita Oka, MD Trauma & General Surgery Please use [email or web address] to contact on call provider  03/22/2020  *Care during the described time interval was provided by me. I have reviewed this patient's available data, including medical history, events of note, physical examination and test results as part of my evaluation.

## 2020-03-22 NOTE — Progress Notes (Signed)
Assisted tele visit to patient with family member.  Geneva Barrero Ann, RN  

## 2020-03-23 LAB — CBC
HCT: 27 % — ABNORMAL LOW (ref 39.0–52.0)
Hemoglobin: 8.9 g/dL — ABNORMAL LOW (ref 13.0–17.0)
MCH: 30.2 pg (ref 26.0–34.0)
MCHC: 33 g/dL (ref 30.0–36.0)
MCV: 91.5 fL (ref 80.0–100.0)
Platelets: 257 10*3/uL (ref 150–400)
RBC: 2.95 MIL/uL — ABNORMAL LOW (ref 4.22–5.81)
RDW: 16.4 % — ABNORMAL HIGH (ref 11.5–15.5)
WBC: 8.7 10*3/uL (ref 4.0–10.5)
nRBC: 0 % (ref 0.0–0.2)

## 2020-03-23 LAB — COMPREHENSIVE METABOLIC PANEL
ALT: 11 U/L (ref 0–44)
AST: 14 U/L — ABNORMAL LOW (ref 15–41)
Albumin: 3.4 g/dL — ABNORMAL LOW (ref 3.5–5.0)
Alkaline Phosphatase: 52 U/L (ref 38–126)
Anion gap: 6 (ref 5–15)
BUN: <5 mg/dL — ABNORMAL LOW (ref 8–23)
CO2: 25 mmol/L (ref 22–32)
Calcium: 8.6 mg/dL — ABNORMAL LOW (ref 8.9–10.3)
Chloride: 103 mmol/L (ref 98–111)
Creatinine, Ser: 0.56 mg/dL — ABNORMAL LOW (ref 0.61–1.24)
GFR calc Af Amer: >60 mL/min (ref >60)
GFR calc non Af Amer: >60 mL/min (ref >60)
Glucose, Bld: 100 mg/dL — ABNORMAL HIGH (ref 70–99)
Potassium: 3.8 mmol/L (ref 3.5–5.1)
Sodium: 134 mmol/L — ABNORMAL LOW (ref 135–145)
Total Bilirubin: 1.1 mg/dL (ref 0.3–1.2)
Total Protein: 5.4 g/dL — ABNORMAL LOW (ref 6.5–8.1)

## 2020-03-23 LAB — PHOSPHORUS: Phosphorus: 3.3 mg/dL (ref 2.5–4.6)

## 2020-03-23 LAB — MAGNESIUM: Magnesium: 1.9 mg/dL (ref 1.7–2.4)

## 2020-03-23 MED ORDER — PRO-STAT SUGAR FREE PO LIQD
30.0000 mL | Freq: Two times a day (BID) | ORAL | Status: DC
  Administered 2020-03-23 (×2): 30 mL
  Filled 2020-03-23 (×3): qty 30

## 2020-03-23 MED ORDER — ACETAMINOPHEN 325 MG PO TABS
650.0000 mg | ORAL_TABLET | Freq: Four times a day (QID) | ORAL | Status: DC | PRN
  Administered 2020-03-23 – 2020-04-01 (×4): 650 mg
  Filled 2020-03-23 (×4): qty 2

## 2020-03-23 MED ORDER — ACETAMINOPHEN 650 MG RE SUPP: 650.0000 mg | Freq: Four times a day (QID) | RECTAL | Status: DC | PRN

## 2020-03-23 MED ORDER — VITAL HIGH PROTEIN PO LIQD
1000.0000 mL | ORAL | Status: DC
  Administered 2020-03-23: 13:00:00 1000 mL

## 2020-03-23 MED ORDER — MAGNESIUM SULFATE IN D5W 1-5 GM/100ML-% IV SOLN
1.0000 g | Freq: Once | INTRAVENOUS | Status: AC
  Administered 2020-03-23: 1 g via INTRAVENOUS
  Filled 2020-03-23: qty 100

## 2020-03-23 MED ORDER — POTASSIUM CHLORIDE 10 MEQ/100ML IV SOLN
10.0000 meq | INTRAVENOUS | Status: AC
  Administered 2020-03-23 (×3): 10 meq via INTRAVENOUS
  Filled 2020-03-23 (×3): qty 100

## 2020-03-23 NOTE — Progress Notes (Signed)
Trauma/Critical Care Follow Up Note  Subjective:    Overnight Issues:   Objective:  Vital signs for last 24 hours: Temp:  [97.5 F (36.4 C)-99.5 F (37.5 C)] 99.5 F (37.5 C) (05/02 0800) Pulse Rate:  [72-95] 72 (05/02 1000) Resp:  [13-21] 20 (05/02 1000) BP: (119-144)/(69-83) 120/74 (05/02 1000) SpO2:  [90 %-99 %] 99 % (05/02 1000) Arterial Line BP: (124)/(82) 124/82 (05/01 1100)  Hemodynamic parameters for last 24 hours:    Intake/Output from previous day: 05/01 0701 - 05/02 0700 In: 3496.9 [I.V.:2490.5; IV Piggyback:814.3] Out: 2805 [Urine:1700; Drains:1105]  Intake/Output this shift: No intake/output data recorded.  Vent settings for last 24 hours:    Physical Exam:  Gen: comfortable, no distress Neuro: non-focal exam HEENT: PERRL Neck: supple CV: RRR Pulm: unlabored breathing Abd: soft, NT, incision c/d/i with staples, JP x2, 1.1L SS o/p GU: clear yellow urine, foley Extr: wwp, no edema   Results for orders placed or performed during the hospital encounter of 03/20/20 (from the past 24 hour(s))  Comprehensive metabolic panel     Status: Abnormal   Collection Time: 03/23/20  2:33 AM  Result Value Ref Range   Sodium 134 (L) 135 - 145 mmol/L   Potassium 3.8 3.5 - 5.1 mmol/L   Chloride 103 98 - 111 mmol/L   CO2 25 22 - 32 mmol/L   Glucose, Bld 100 (H) 70 - 99 mg/dL   BUN <5 (L) 8 - 23 mg/dL   Creatinine, Ser 0.56 (L) 0.61 - 1.24 mg/dL   Calcium 8.6 (L) 8.9 - 10.3 mg/dL   Total Protein 5.4 (L) 6.5 - 8.1 g/dL   Albumin 3.4 (L) 3.5 - 5.0 g/dL   AST 14 (L) 15 - 41 U/L   ALT 11 0 - 44 U/L   Alkaline Phosphatase 52 38 - 126 U/L   Total Bilirubin 1.1 0.3 - 1.2 mg/dL   GFR calc non Af Amer >60 >60 mL/min   GFR calc Af Amer >60 >60 mL/min   Anion gap 6 5 - 15  Magnesium     Status: None   Collection Time: 03/23/20  2:33 AM  Result Value Ref Range   Magnesium 1.9 1.7 - 2.4 mg/dL  Phosphorus     Status: None   Collection Time: 03/23/20  2:33 AM  Result  Value Ref Range   Phosphorus 3.3 2.5 - 4.6 mg/dL  CBC     Status: Abnormal   Collection Time: 03/23/20  2:33 AM  Result Value Ref Range   WBC 8.7 4.0 - 10.5 K/uL   RBC 2.95 (L) 4.22 - 5.81 MIL/uL   Hemoglobin 8.9 (L) 13.0 - 17.0 g/dL   HCT 27.0 (L) 39.0 - 52.0 %   MCV 91.5 80.0 - 100.0 fL   MCH 30.2 26.0 - 34.0 pg   MCHC 33.0 30.0 - 36.0 g/dL   RDW 16.4 (H) 11.5 - 15.5 %   Platelets 257 150 - 400 K/uL   nRBC 0.0 0.0 - 0.2 %    Assessment & Plan:  Present on Admission: . 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   and I reviewed the patients new imaging test results.    s/pProcedure(s): LAPAROSCOPY DIAGNOSTIC (N/A) Resection of Third and Fourth Portions of Duodenum (N/A) Pyloroplasty (N/A) Gastrojejunostomy (N/A) Insertion Of Gastrojejunostomy Tube (N/A)  Duodenal obstruction and lymphadenopathy, await pathology Add 25% albumin boluses.  Epidural/pca  for pain control stay NPO, start TF ABL anemia - monitor Hypomagnesemia and hypokalemia - replete  AMBULATE   Jesusita Oka, MD Trauma & General Surgery Please use [email or web address] to contact on call provider  03/23/2020  *Care during the described time interval was provided by me. I have reviewed this patient's available data, including medical history, events of note, physical examination and test results as part of my evaluation.

## 2020-03-23 NOTE — Anesthesia Post-op Follow-up Note (Signed)
  Anesthesia Pain Follow-up Note  Patient: Albert Hartman  Day #: 3  Date of Follow-up: 03/23/2020 Time: 4:41 PM  Last Vitals:  Vitals:   03/23/20 1239 03/23/20 1611  BP:    Pulse:    Resp: 14 (!) 24  Temp:    SpO2: 97% 99%    Level of Consciousness: alert  Pain: 2-4 /10   Side Effects:Pruritis  Catheter Site Exam:clean, dry, no drainage  Epidural / Intrathecal (From admission, onward)   Start     Dose/Rate Route Frequency Ordered Stop   03/20/20 1330  ropivacaine (PF) 2 mg/mL (0.2%) (NAROPIN) injection     6-10 mL/hr 6-10 mL/hr  Epidural Continuous 03/20/20 1328         Plan: Pt given small bolus and pump set to 75ml/hr. Discussed with RN "My" at bedside.Continue current therapy of postop epidural at surgeon's request  Nolon Nations

## 2020-03-24 LAB — CBC
HCT: 29.4 % — ABNORMAL LOW (ref 39.0–52.0)
Hemoglobin: 9.7 g/dL — ABNORMAL LOW (ref 13.0–17.0)
MCH: 30.4 pg (ref 26.0–34.0)
MCHC: 33 g/dL (ref 30.0–36.0)
MCV: 92.2 fL (ref 80.0–100.0)
Platelets: 271 10*3/uL (ref 150–400)
RBC: 3.19 MIL/uL — ABNORMAL LOW (ref 4.22–5.81)
RDW: 15.9 % — ABNORMAL HIGH (ref 11.5–15.5)
WBC: 7.2 10*3/uL (ref 4.0–10.5)
nRBC: 0 % (ref 0.0–0.2)

## 2020-03-24 LAB — COMPREHENSIVE METABOLIC PANEL
ALT: 10 U/L (ref 0–44)
AST: 18 U/L (ref 15–41)
Albumin: 2.7 g/dL — ABNORMAL LOW (ref 3.5–5.0)
Alkaline Phosphatase: 58 U/L (ref 38–126)
Anion gap: 11 (ref 5–15)
BUN: 7 mg/dL — ABNORMAL LOW (ref 8–23)
CO2: 23 mmol/L (ref 22–32)
Calcium: 8.4 mg/dL — ABNORMAL LOW (ref 8.9–10.3)
Chloride: 100 mmol/L (ref 98–111)
Creatinine, Ser: 0.5 mg/dL — ABNORMAL LOW (ref 0.61–1.24)
GFR calc Af Amer: >60 mL/min (ref >60)
GFR calc non Af Amer: >60 mL/min (ref >60)
Glucose, Bld: 96 mg/dL (ref 70–99)
Potassium: 3.7 mmol/L (ref 3.5–5.1)
Sodium: 134 mmol/L — ABNORMAL LOW (ref 135–145)
Total Bilirubin: 1 mg/dL (ref 0.3–1.2)
Total Protein: 5 g/dL — ABNORMAL LOW (ref 6.5–8.1)

## 2020-03-24 LAB — GLUCOSE, CAPILLARY
Glucose-Capillary: 95 mg/dL (ref 70–99)
Glucose-Capillary: 95 mg/dL (ref 70–99)
Glucose-Capillary: 98 mg/dL (ref 70–99)

## 2020-03-24 MED ORDER — PIVOT 1.5 CAL PO LIQD: 1000.0000 mL | ORAL | Status: DC

## 2020-03-24 MED ORDER — ACETAMINOPHEN 10 MG/ML IV SOLN: 1000.0000 mg | Freq: Four times a day (QID) | INTRAVENOUS | Status: AC | PRN

## 2020-03-24 MED ORDER — VITAL HIGH PROTEIN PO LIQD
1000.0000 mL | ORAL | Status: DC
  Administered 2020-03-24: 12:00:00 1000 mL

## 2020-03-24 MED ORDER — VITAL HIGH PROTEIN PO LIQD
1000.0000 mL | ORAL | Status: AC
  Administered 2020-03-24: 1000 mL

## 2020-03-24 MED ORDER — VITAL HIGH PROTEIN PO LIQD
1000.0000 mL | ORAL | Status: DC
Start: 2020-03-24 — End: 2020-03-24

## 2020-03-24 NOTE — Progress Notes (Signed)
Trauma/Critical Care Follow Up Note  Subjective:    Overnight Issues:  NO flatus yet.  Still having occasional hiccups.  Pain managed.     Objective:  Vital signs for last 24 hours: Temp:  [98.1 F (36.7 C)-100.4 F (38 C)] 98.7 F (37.1 C) (05/03 0800) Pulse Rate:  [72-92] 86 (05/03 0900) Resp:  [13-24] 14 (05/03 0931) BP: (111-142)/(59-102) 142/80 (05/03 0900) SpO2:  [85 %-100 %] 96 % (05/03 0931) FiO2 (%):  [29 %] 29 % (05/03 0931)  Hemodynamic parameters for last 24 hours:    Intake/Output from previous day: 05/02 0701 - 05/03 0700 In: 2708.7 [I.V.:2408.7; IV Piggyback:300] Out: 2390 [Urine:900; Drains:1490]  Intake/Output this shift: Total I/O In: 99.5 [I.V.:99.5] Out: 680 [Urine:435; Drains:245]  Vent settings for last 24 hours: FiO2 (%):  [29 %] 29 %  Physical Exam:  Gen: comfortable, no distress Neuro: non-focal exam.  Alert. Getting cleaned up.   HEENT: PERRL Neck: supple CV: RRR Pulm: unlabored breathing Abd: soft, NT, incision c/d/i with staples, JP x2, 1.1L SS o/p GU: clear yellow urine, foley Extr: wwp, no edema   Results for orders placed or performed during the hospital encounter of 03/20/20 (from the past 24 hour(s))  Comprehensive metabolic panel     Status: Abnormal   Collection Time: 03/24/20  6:13 AM  Result Value Ref Range   Sodium 134 (L) 135 - 145 mmol/L   Potassium 3.7 3.5 - 5.1 mmol/L   Chloride 100 98 - 111 mmol/L   CO2 23 22 - 32 mmol/L   Glucose, Bld 96 70 - 99 mg/dL   BUN 7 (L) 8 - 23 mg/dL   Creatinine, Ser 0.50 (L) 0.61 - 1.24 mg/dL   Calcium 8.4 (L) 8.9 - 10.3 mg/dL   Total Protein 5.0 (L) 6.5 - 8.1 g/dL   Albumin 2.7 (L) 3.5 - 5.0 g/dL   AST 18 15 - 41 U/L   ALT 10 0 - 44 U/L   Alkaline Phosphatase 58 38 - 126 U/L   Total Bilirubin 1.0 0.3 - 1.2 mg/dL   GFR calc non Af Amer >60 >60 mL/min   GFR calc Af Amer >60 >60 mL/min   Anion gap 11 5 - 15  CBC     Status: Abnormal   Collection Time: 03/24/20  6:13 AM    Result Value Ref Range   WBC 7.2 4.0 - 10.5 K/uL   RBC 3.19 (L) 4.22 - 5.81 MIL/uL   Hemoglobin 9.7 (L) 13.0 - 17.0 g/dL   HCT 29.4 (L) 39.0 - 52.0 %   MCV 92.2 80.0 - 100.0 fL   MCH 30.4 26.0 - 34.0 pg   MCHC 33.0 30.0 - 36.0 g/dL   RDW 15.9 (H) 11.5 - 15.5 %   Platelets 271 150 - 400 K/uL   nRBC 0.0 0.0 - 0.2 %    Assessment & Plan:  Present on Admission: . 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   and I reviewed the patients new imaging test results.    s/pProcedure(s): LAPAROSCOPY DIAGNOSTIC (N/A) Resection of Third and Fourth Portions of Duodenum (N/A) Pyloroplasty (N/A) Gastrojejunostomy (N/A) Insertion Of Gastrojejunostomy Tube (N/A)  Duodenal obstruction and lymphadenopathy, await pathology  Epidural/pca for pain control stay NPO, increase tube feeds. Plan to start clamping trials on the g tube tomorrow.   ABL anemia - monitor    Milus Height,  MD FACS Surgical Oncology, General Surgery, Trauma and [facility], [place] [phone] for weekday/non holidays Check [email or web address] for coverage night/weekend/holidays  Do not use SecureChat as it is not reliable for patient care.

## 2020-03-24 NOTE — Anesthesia Post-op Follow-up Note (Signed)
  Anesthesia Pain Follow-up Note  Patient: Albert Hartman  Day #: 4  Date of Follow-up: 03/24/2020 Time: 7:21 PM  Last Vitals:  Vitals:   03/24/20 1800 03/24/20 1900  BP: 111/60 110/69  Pulse: 84 79  Resp: 17 16  Temp:    SpO2: 92% 93%    Level of Consciousness: alert  Pain: mild   Side Effects:None  Catheter Site Exam:clean, dry, no drainage  Epidural / Intrathecal (From admission, onward)   Start     Dose/Rate Route Frequency Ordered Stop   03/20/20 1330  ropivacaine (PF) 2 mg/mL (0.2%) (NAROPIN) injection     6-10 mL/hr 6-10 mL/hr  Epidural Continuous 03/20/20 1328         Plan: Continue current therapy of postop epidural at surgeon's request  Taeko Schaffer,W. EDMOND

## 2020-03-24 NOTE — Progress Notes (Signed)
Occupational Therapy Treatment Note  Pt able to ambulate around unit without rest breaks on RA @ RW level with min A for safety/steady A. Educated pt on options for bed mobility to reduce pain. May benefit from use of AE for LB ADL. Pt very appreciative. Will continue to follow acutely.     03/24/20 1900  OT Visit Information  Last OT Received On 03/24/20  Assistance Needed +1 (for line management, not pt management.)  History of Present Illness Pt is a 74 yo M referred by Dr. Penelope Coop for duodenal obstruction. The patient presented with around a 50-70 lb weight loss since christmas.  He doesn't have much of an appetite.  He has periodically had severe episodes of throwing up. 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 Pt underwent Diagnostic laparoscopy, resection of 3rd, 4th portion of duodenum, gastrojejunostomy, GJ tube, pyloroplasty on 4/29. PMH includes HTN, sleep apnea, reflux.  Precautions  Precautions Fall;Other (comment)  Precaution Comments multiple drains, G tube, epidural, PCA, abdominal precautions  Pain Assessment  Pain Assessment 0-10  Pain Score 8  Pain Location abdomen  Pain Descriptors / Indicators Grimacing  Pain Intervention(s) Limited activity within patient's tolerance  Cognition  Arousal/Alertness Awake/alert  Behavior During Therapy WFL for tasks assessed/performed  Overall Cognitive Status Within Functional Limits for tasks assessed  Upper Extremity Assessment  Upper Extremity Assessment Generalized weakness  Lower Extremity Assessment  Lower Extremity Assessment Defer to PT evaluation  ADL  Overall ADL's  Needs assistance/impaired  Lower Body Bathing Minimal assistance;Sit to/from stand  Lower Body Dressing Moderate assistance;Sit to/from stand  Lower Body Dressing Details (indicate cue type and reason) May benefit form reacher/sock aid  Functional mobility during ADLs Rolling walker;Minimal assistance  Bed Mobility  Overal  bed mobility Needs Assistance  Bed Mobility Sit to Sidelying;Sidelying to Sit  Sidelying to sit Min guard;HOB elevated  General bed mobility comments Educated pt on using strong legs to roll the n push to upright form sidelying. A required to transition to upright position  Balance  Overall balance assessment Needs assistance  Sitting-balance support Feet supported;Bilateral upper extremity supported  Sitting balance-Leahy Scale Fair  Sitting balance - Comments close supervision EOB.  One hand hugging pillow and one hand propping on bed.   Standing balance support Bilateral upper extremity supported  Standing balance-Leahy Scale Fair  Standing balance comment close supervision for static standing, preference for pain is to stabilize on RW, but I think he could let go if he needed.  Restrictions  Other Position/Activity Restrictions abdominal precautions  Transfers  Overall transfer level Needs assistance  Equipment used Rolling walker (2 wheeled)  Transfers Sit to/from [employer] Transfers  Sit to Stand Min assist  Stand pivot transfers Min assist  General transfer comment Min assist to come to standing to RW, cues for safe hand placement and safety during transitions.  Pivotal steps chair to bed and then bed back to chair.   General Comments  General comments (skin integrity, edema, etc.) VSS  Exercises  Exercises Other exercises  Other Exercises  Other Exercises encouraged chair marches  Other Exercises incentive spirometer x 5 - able to pull 1250 ml  OT - End of Session  Equipment Utilized During Treatment Gait belt;Rolling walker  Activity Tolerance Patient tolerated treatment well  Patient left in chair;with call bell/phone within reach;with chair alarm set;with family/visitor present  Nurse Communication Mobility status  OT Assessment/Plan  OT Plan Discharge plan remains appropriate  OT Visit  Diagnosis Unsteadiness on feet (R26.81);Other abnormalities of gait and  mobility (R26.89);Muscle weakness (generalized) (M62.81);Pain  Pain - part of body  (abdomen)  OT Frequency (ACUTE ONLY) Min 3X/week  Follow Up Recommendations No OT follow up;Supervision - Intermittent  OT Equipment None recommended by OT  AM-PAC OT "6 Clicks" Daily Activity Outcome Measure (Version 2)  Help from another person eating meals? 1  Help from another person taking care of personal grooming? 3  Help from another person toileting, which includes using toliet, bedpan, or urinal? 3  Help from another person bathing (including washing, rinsing, drying)? 3  Help from another person to put on and taking off regular upper body clothing? 3  Help from another person to put on and taking off regular lower body clothing? 2  6 Click Score 15  OT Goal Progression  Progress towards OT goals Progressing toward goals  Acute Rehab OT Goals  Patient Stated Goal To return to independence  OT Goal Formulation With patient  Time For Goal Achievement 04/04/20  Potential to Achieve Goals Good  ADL Goals  Pt Will Perform Grooming with modified independence;standing  Pt Will Perform Lower Body Dressing with modified independence;sit to/from stand  Pt Will Transfer to Toilet with modified independence;ambulating;bedside commode  Pt Will Perform Toileting - Clothing Manipulation and hygiene with modified independence;sit to/from stand;sitting/lateral leans  OT Time Calculation  OT Start Time (ACUTE ONLY) 1434  OT Stop Time (ACUTE ONLY) 1513  OT Time Calculation (min) 39 min  OT General Charges  $OT Visit 1 Visit  OT Treatments  $Self Care/Home Management  38-52 mins  Maurie Boettcher, OT/L   Acute OT Clinical Specialist [place] Pager [phone] Office [phone]

## 2020-03-24 NOTE — Progress Notes (Signed)
Nutrition Follow-up  DOCUMENTATION CODES:   Not applicable  INTERVENTION:   Supplement diet once advanced  Transition to Pivot 1.5 @ 20 ml/hr   As able recommend advance to goal rate Pivot 1.5 @ 60 ml/hr   Provides: 2340 kcal, 146 grams protein, and 1184 ml free water.    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 Ongoing.   GOAL:   Patient will meet greater than or equal to 90% of their needs Progressing.   MONITOR:   I & O's  REASON FOR ASSESSMENT:   Rounds    ASSESSMENT:   Pt with PMH of HTN and GERD who was dx with duodenal mass/stricture now admitted for surgery now s/p resection of 3rd, 4th portion of duodenum, gastrojejunostomy, GJ tube placement.   Pt discussed during ICU rounds and with RN.  Per RN pt tolerating TF well and will be advanced to 20 ml/hr Per RN pt with a G-J tube with 3 ports but unable to give any medications/prostat through the tube.   5/2 TF started @ 10 ml 5/3 TF increased to 20 ml/hr  Medications reviewed and include:  D5 NS @ 100 ml/hr  Labs reviewed: Na 134 (L)  Drain 1: 380 ml Drain 2: 375 ml G port: 735 ml  TF: Vital High Protein @ 20 ml/hr  Provides: 480 kcal and 42 grams protein   Diet Order:   Diet Order            Diet NPO time specified  Diet effective now              EDUCATION NEEDS:   No education needs have been identified at this time  Skin:  Skin Assessment: Reviewed RN Assessment  Last BM:  unknown  Height:   Ht Readings from Last 1 Encounters:  03/20/20 6' 1.5" (1.867 m)    Weight:   Wt Readings from Last 1 Encounters:  03/20/20 98.3 kg    Ideal Body Weight:  86.3 kg  BMI:  Body mass index is 28.2 kg/m.  Estimated Nutritional Needs:   Kcal:  2300-2500  Protein:  120-145 grams  Fluid:  >2 L/day  Lockie Pares., RD, LDN, CNSC See  [facility] for contact information

## 2020-03-24 NOTE — Progress Notes (Signed)
Physical Therapy Treatment Patient Details Name: Albert Hartman MRN: [id number] DOB: Jul 02, 1946 Today's Date: 03/24/2020    History of Present Illness Pt is a 74 yo M referred by Dr. Penelope Coop for duodenal obstruction. The patient presented with around a 50-70 lb weight loss since christmas.  He doesn't have much of an appetite.  He has periodically had severe episodes of throwing up. 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 Pt underwent Diagnostic laparoscopy, resection of 3rd, 4th portion of duodenum, gastrojejunostomy, GJ tube, pyloroplasty on 4/29. PMH includes HTN, sleep apnea, reflux.    PT Comments    I spoke with OT prior to session who reported pt needed to practice log roll and reverse log roll to help get into and out of bed.  I simulated home bed set up best I could (high bed with wedge pillow) with the exception of pt did use the bed railing (we can practice without closer to d/c).  Pt should be ready for gait next session and if he continues to have his current lines will need a second person to manage his two IV poles.  PT will continue to follow acutely for safe mobility progression.   Follow Up Recommendations  Home health PT;Supervision - Intermittent     Equipment Recommendations  None recommended by PT    Recommendations for Other Services   NA     Precautions / Restrictions Precautions Precautions: Fall;Other (comment) Precaution Comments: multiple drains, G tube, epidural, PCA, abdominal precautions Restrictions Other Position/Activity Restrictions: abdominal precautions    Mobility  Bed Mobility Overal bed mobility: Needs Assistance Bed Mobility: Sit to Sidelying;Sidelying to Sit   Sidelying to sit: Min guard;HOB elevated     Sit to sidelying: Mod assist;HOB elevated General bed mobility comments: Simulated getting into and out of bed at home using log roll and reverse log roll technique and pillow (hug to brace abdomen).   Elevated bed and got in and out of the R side as if he was home, the only exception is that he does not have a rail and we did utilize the rail today. He has a wedge pillow, so I kept HOB mildly elevated.  Min guard assist to come to sitting from side lying, and mod assist to help lift both legs to go from sitting to semi side lying (he had difficulty not flipping to his back).   Transfers Overall transfer level: Needs assistance Equipment used: Rolling walker (2 wheeled) Transfers: Sit to/from [facility] Sit to Stand: Min assist Stand pivot transfers: Min assist       General transfer comment: Min assist to come to standing to RW, cues for safe hand placement and safety during transitions.  Pivotal steps chair to bed and then bed back to chair.       Balance Overall balance assessment: Needs assistance Sitting-balance support: Feet supported;Bilateral upper extremity supported Sitting balance-Leahy Scale: Fair Sitting balance - Comments: close supervision EOB.  One hand hugging pillow and one hand propping on bed.    Standing balance support: Bilateral upper extremity supported Standing balance-Leahy Scale: Fair Standing balance comment: close supervision for static standing, preference for pain is to stabilize on RW, but I think he could let go if he needed.                            Cognition Arousal/Alertness: Awake/alert Behavior During Therapy: WFL for tasks assessed/performed Overall Cognitive  Status: Within Functional Limits for tasks assessed                                           General Comments General comments (skin integrity, edema, etc.): VSS on RA throughout session.        Pertinent Vitals/Pain Pain Assessment: Faces Faces Pain Scale: Hurts whole lot Pain Location: abdomen Pain Descriptors / Indicators: Grimacing Pain Intervention(s): Limited activity within patient's tolerance;Monitored during  session;Repositioned;PCA encouraged           PT Goals (current goals can now be found in the care plan section) Acute Rehab PT Goals Patient Stated Goal: To return to independence Progress towards PT goals: Progressing toward goals    Frequency    Min 3X/week      PT Plan Current plan remains appropriate      AM-PAC PT "6 Clicks" Mobility   Outcome Measure  Help needed turning from your back to your side while in a flat bed without using bedrails?: A Little Help needed moving from lying on your back to sitting on the side of a flat bed without using bedrails?: A Little Help needed moving to and from a bed to a chair (including a wheelchair)?: A Little Help needed standing up from a chair using your arms (e.g., wheelchair or bedside chair)?: A Little Help needed to walk in hospital room?: A Little Help needed climbing 3-5 steps with a railing? : A Lot 6 Click Score: 17    End of Session   Activity Tolerance: Patient limited by pain Patient left: in chair;with call bell/phone within reach;with family/visitor present   PT Visit Diagnosis: Other abnormalities of gait and mobility (R26.89);Pain;Muscle weakness (generalized) (M62.81) Pain - Right/Left: (incisional) Pain - part of body: (abdomen)     Time: [id number] PT Time Calculation (min) (ACUTE ONLY): 29 min  Charges:  $Therapeutic Activity: 23-37 mins                    Verdene Lennert, PT, DPT  Acute Rehabilitation [phone] pager #[phone] office     03/24/2020, 4:47 PM

## 2020-03-24 NOTE — Progress Notes (Signed)
Notified MD Byerly regarding Albert Hartman valve unable to be connected to [place] tube for medication administration (port is luer lock).  TF going in without problem. Only scheduled med at this time is Prostat--do not give med.  If pt runs fever give acetaminophen rectal or IV (order for acetaminophen 1000mg  q6h prn).

## 2020-03-25 LAB — GLUCOSE, CAPILLARY
Glucose-Capillary: 104 mg/dL — ABNORMAL HIGH (ref 70–99)
Glucose-Capillary: 99 mg/dL (ref 70–99)
Glucose-Capillary: 105 mg/dL — ABNORMAL HIGH (ref 70–99)
Glucose-Capillary: 108 mg/dL — ABNORMAL HIGH (ref 70–99)
Glucose-Capillary: 107 mg/dL — ABNORMAL HIGH (ref 70–99)

## 2020-03-25 LAB — CBC
HCT: 29.4 % — ABNORMAL LOW (ref 39.0–52.0)
Hemoglobin: 9.9 g/dL — ABNORMAL LOW (ref 13.0–17.0)
MCH: 30.7 pg (ref 26.0–34.0)
MCHC: 33.7 g/dL (ref 30.0–36.0)
MCV: 91 fL (ref 80.0–100.0)
Platelets: 333 10*3/uL (ref 150–400)
RBC: 3.23 MIL/uL — ABNORMAL LOW (ref 4.22–5.81)
RDW: 15.6 % — ABNORMAL HIGH (ref 11.5–15.5)
WBC: 5.4 10*3/uL (ref 4.0–10.5)
nRBC: 0 % (ref 0.0–0.2)

## 2020-03-25 LAB — COMPREHENSIVE METABOLIC PANEL
ALT: 11 U/L (ref 0–44)
AST: 15 U/L (ref 15–41)
Albumin: 2.4 g/dL — ABNORMAL LOW (ref 3.5–5.0)
Alkaline Phosphatase: 70 U/L (ref 38–126)
Anion gap: 9 (ref 5–15)
BUN: 6 mg/dL — ABNORMAL LOW (ref 8–23)
CO2: 26 mmol/L (ref 22–32)
Calcium: 8.3 mg/dL — ABNORMAL LOW (ref 8.9–10.3)
Chloride: 100 mmol/L (ref 98–111)
Creatinine, Ser: 0.5 mg/dL — ABNORMAL LOW (ref 0.61–1.24)
GFR calc Af Amer: >60 mL/min (ref >60)
GFR calc non Af Amer: >60 mL/min (ref >60)
Glucose, Bld: 112 mg/dL — ABNORMAL HIGH (ref 70–99)
Potassium: 3.3 mmol/L — ABNORMAL LOW (ref 3.5–5.1)
Sodium: 135 mmol/L (ref 135–145)
Total Bilirubin: 0.7 mg/dL (ref 0.3–1.2)
Total Protein: 4.6 g/dL — ABNORMAL LOW (ref 6.5–8.1)

## 2020-03-25 LAB — PROTIME-INR
INR: 1.2 (ref 0.8–1.2)
Prothrombin Time: 14.3 seconds (ref 11.4–15.2)

## 2020-03-25 MED ORDER — ACETAMINOPHEN 10 MG/ML IV SOLN
1000.0000 mg | Freq: Four times a day (QID) | INTRAVENOUS | Status: AC | PRN
  Administered 2020-03-26: 1000 mg via INTRAVENOUS
  Filled 2020-03-25: qty 100

## 2020-03-25 MED ORDER — POTASSIUM CHLORIDE 10 MEQ/100ML IV SOLN
10.0000 meq | INTRAVENOUS | Status: AC
  Administered 2020-03-25 (×6): 10 meq via INTRAVENOUS
  Filled 2020-03-25 (×6): qty 100

## 2020-03-25 MED ORDER — PIVOT 1.5 CAL PO LIQD
1000.0000 mL | ORAL | Status: DC
  Administered 2020-03-25 – 2020-03-26 (×2): 1000 mL
  Filled 2020-03-25 (×2): qty 1000

## 2020-03-25 NOTE — Progress Notes (Signed)
Trauma/Critical Care Follow Up Note  Subjective:    Overnight Issues:  No flatus.  No nausea.  Doing well.     Objective:  Vital signs for last 24 hours: Temp:  [98 F (36.7 C)-99.4 F (37.4 C)] 98.7 F (37.1 C) (05/04 1318) Pulse Rate:  [79-95] 89 (05/04 1318) Resp:  [10-20] 16 (05/04 1318) BP: (105-146)/(59-81) 146/81 (05/04 1318) SpO2:  [92 %-97 %] 96 % (05/04 1318)    Intake/Output from previous day: 05/03 0701 - 05/04 0700 In: 2717.7 [I.V.:2381.7; NG/GT:180] Out: [id number] [Urine:1820; Drains:2165]  Intake/Output this shift: Total I/O In: 669.1 [I.V.:488.8; Other:45; NG/GT:106; IV Piggyback:29.3] Out: 930 [Urine:220; Drains:710]  Vent settings for last 24 hours:    Physical Exam:  Gen: comfortable, no distress Neuro: non-focal exam.  Alert. Oriented.  Joking around.   HEENT: PERRL Neck: supple CV: RRR Pulm: unlabored breathing Abd: soft, NT, incision c/d/i with staples, JP x2, both drains serosang.  J tube working. GU: clear yellow urine, foley Extr: wwp, no edema   Results for orders placed or performed during the hospital encounter of 03/20/20 (from the past 24 hour(s))  Glucose, capillary     Status: None   Collection Time: 03/24/20  7:52 PM  Result Value Ref Range   Glucose-Capillary 95 70 - 99 mg/dL  Glucose, capillary     Status: None   Collection Time: 03/24/20 11:40 PM  Result Value Ref Range   Glucose-Capillary 98 70 - 99 mg/dL  Glucose, capillary     Status: Abnormal   Collection Time: 03/25/20  3:39 AM  Result Value Ref Range   Glucose-Capillary 104 (H) 70 - 99 mg/dL  Comprehensive metabolic panel     Status: Abnormal   Collection Time: 03/25/20  6:21 AM  Result Value Ref Range   Sodium 135 135 - 145 mmol/L   Potassium 3.3 (L) 3.5 - 5.1 mmol/L   Chloride 100 98 - 111 mmol/L   CO2 26 22 - 32 mmol/L   Glucose, Bld 112 (H) 70 - 99 mg/dL   BUN 6 (L) 8 - 23 mg/dL   Creatinine, Ser 0.50 (L) 0.61 - 1.24 mg/dL   Calcium 8.3 (L) 8.9 - 10.3  mg/dL   Total Protein 4.6 (L) 6.5 - 8.1 g/dL   Albumin 2.4 (L) 3.5 - 5.0 g/dL   AST 15 15 - 41 U/L   ALT 11 0 - 44 U/L   Alkaline Phosphatase 70 38 - 126 U/L   Total Bilirubin 0.7 0.3 - 1.2 mg/dL   GFR calc non Af Amer >60 >60 mL/min   GFR calc Af Amer >60 >60 mL/min   Anion gap 9 5 - 15  CBC     Status: Abnormal   Collection Time: 03/25/20  6:21 AM  Result Value Ref Range   WBC 5.4 4.0 - 10.5 K/uL   RBC 3.23 (L) 4.22 - 5.81 MIL/uL   Hemoglobin 9.9 (L) 13.0 - 17.0 g/dL   HCT 29.4 (L) 39.0 - 52.0 %   MCV 91.0 80.0 - 100.0 fL   MCH 30.7 26.0 - 34.0 pg   MCHC 33.7 30.0 - 36.0 g/dL   RDW 15.6 (H) 11.5 - 15.5 %   Platelets 333 150 - 400 K/uL   nRBC 0.0 0.0 - 0.2 %  Glucose, capillary     Status: None   Collection Time: 03/25/20  7:56 AM  Result Value Ref Range   Glucose-Capillary 99 70 - 99 mg/dL  Protime-INR  Status: None   Collection Time: 03/25/20 10:18 AM  Result Value Ref Range   Prothrombin Time 14.3 11.4 - 15.2 seconds   INR 1.2 0.8 - 1.2  Glucose, capillary     Status: Abnormal   Collection Time: 03/25/20 11:48 AM  Result Value Ref Range   Glucose-Capillary 105 (H) 70 - 99 mg/dL    Assessment & Plan:  Present on Admission: . 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   and I reviewed the patients new imaging test results.    s/pProcedure(s): LAPAROSCOPY DIAGNOSTIC (N/A) Resection of Third and Fourth Portions of Duodenum (N/A) Pyloroplasty (N/A) Gastrojejunostomy (N/A) Insertion Of Gastrojejunostomy Tube (N/A)  Duodenal obstruction and lymphadenopathy, await pathology  D/c epidural today.   Sips and chips today.  Increase tube feeds. Plan to start clamping trials on the g tube today. Increase tube feeds.  ABL anemia - monitor  Transfer to floor.  Milus Height, MD FACS Surgical Oncology, General Surgery, Trauma and [place]  [facility], [place] for weekday/non holidays Check [email or web address] for coverage night/weekend/holidays  Do not use SecureChat as it is not reliable for timely patient care.

## 2020-03-25 NOTE — Progress Notes (Signed)
MD Byerly notified of potassium 3.3. Order for 6 runs K peripheral.

## 2020-03-25 NOTE — Anesthesia Post-op Follow-up Note (Signed)
  Anesthesia Pain Follow-up Note  Patient: Albert Hartman  Day #: 5  Date of Follow-up: 03/25/2020 Time: 10:31 AM  Last Vitals:  Vitals:   03/25/20 0805 03/25/20 0905  BP: 126/76 124/74  Pulse: 81 92  Resp: 16 10  Temp:    SpO2: 93% 96%    Level of Consciousness: alert  Pain: mild   Side Effects:None  Catheter Site Exam:clean, dry, no drainage  Epidural / Intrathecal (From admission, onward)    Start     Dose/Rate Route Frequency Ordered Stop   03/20/20 1330  ropivacaine (PF) 2 mg/mL (0.2%) (NAROPIN) injection     6-10 mL/hr 6-10 mL/hr  Epidural Continuous 03/20/20 1328          Plan: Catheter removed/tip intact at surgeon's request, D/C Infusion at surgeon's request and D/C from anesthesia care at surgeon's request May start enoxaparin or other for DVT prophylaxis at 1500.  Nolon Nations

## 2020-03-25 NOTE — Progress Notes (Signed)
PT Cancellation Note  Patient Details Name: Albert Hartman MRN: [id number] DOB: 05/19/1946   Cancelled Treatment:    Reason Eval/Treat Not Completed: Other (comment).  Pt attempting to rest.  Reporting it is difficult due to IV potassium infusion burning.  He walked earlier today with RN staff in ICU and did not feel up for going again with me or repositioning OOB in the recliner chair.  He was agreeable for me to check back again tomorrow.   Thanks,  Verdene Lennert, PT, DPT  Acute Rehabilitation [phone] pager #[phone] office       [name] [name] 03/25/2020, 3:43 PM

## 2020-03-25 NOTE — Progress Notes (Signed)
G tube clamped [id number]; pt reports no nausea. Open [id number], and reclamped @ 1215.   Pt transferred to 6N22 with all belongings, G tube clamped at this time.

## 2020-03-26 ENCOUNTER — Inpatient Hospital Stay (HOSPITAL_COMMUNITY): Payer: Medicare Other

## 2020-03-26 LAB — GLUCOSE, CAPILLARY
Glucose-Capillary: 101 mg/dL — ABNORMAL HIGH (ref 70–99)
Glucose-Capillary: 104 mg/dL — ABNORMAL HIGH (ref 70–99)
Glucose-Capillary: 105 mg/dL — ABNORMAL HIGH (ref 70–99)
Glucose-Capillary: 105 mg/dL — ABNORMAL HIGH (ref 70–99)
Glucose-Capillary: 107 mg/dL — ABNORMAL HIGH (ref 70–99)
Glucose-Capillary: 96 mg/dL (ref 70–99)

## 2020-03-26 LAB — MAGNESIUM: Magnesium: 1.5 mg/dL — ABNORMAL LOW (ref 1.7–2.4)

## 2020-03-26 IMAGING — DX DG CHEST 1V PORT
1 series · 1 of 1 positions shown · non-contrast
Comparison: None.

CLINICAL DATA: Fever

EXAM:
PORTABLE CHEST 1 VIEW

[chest]
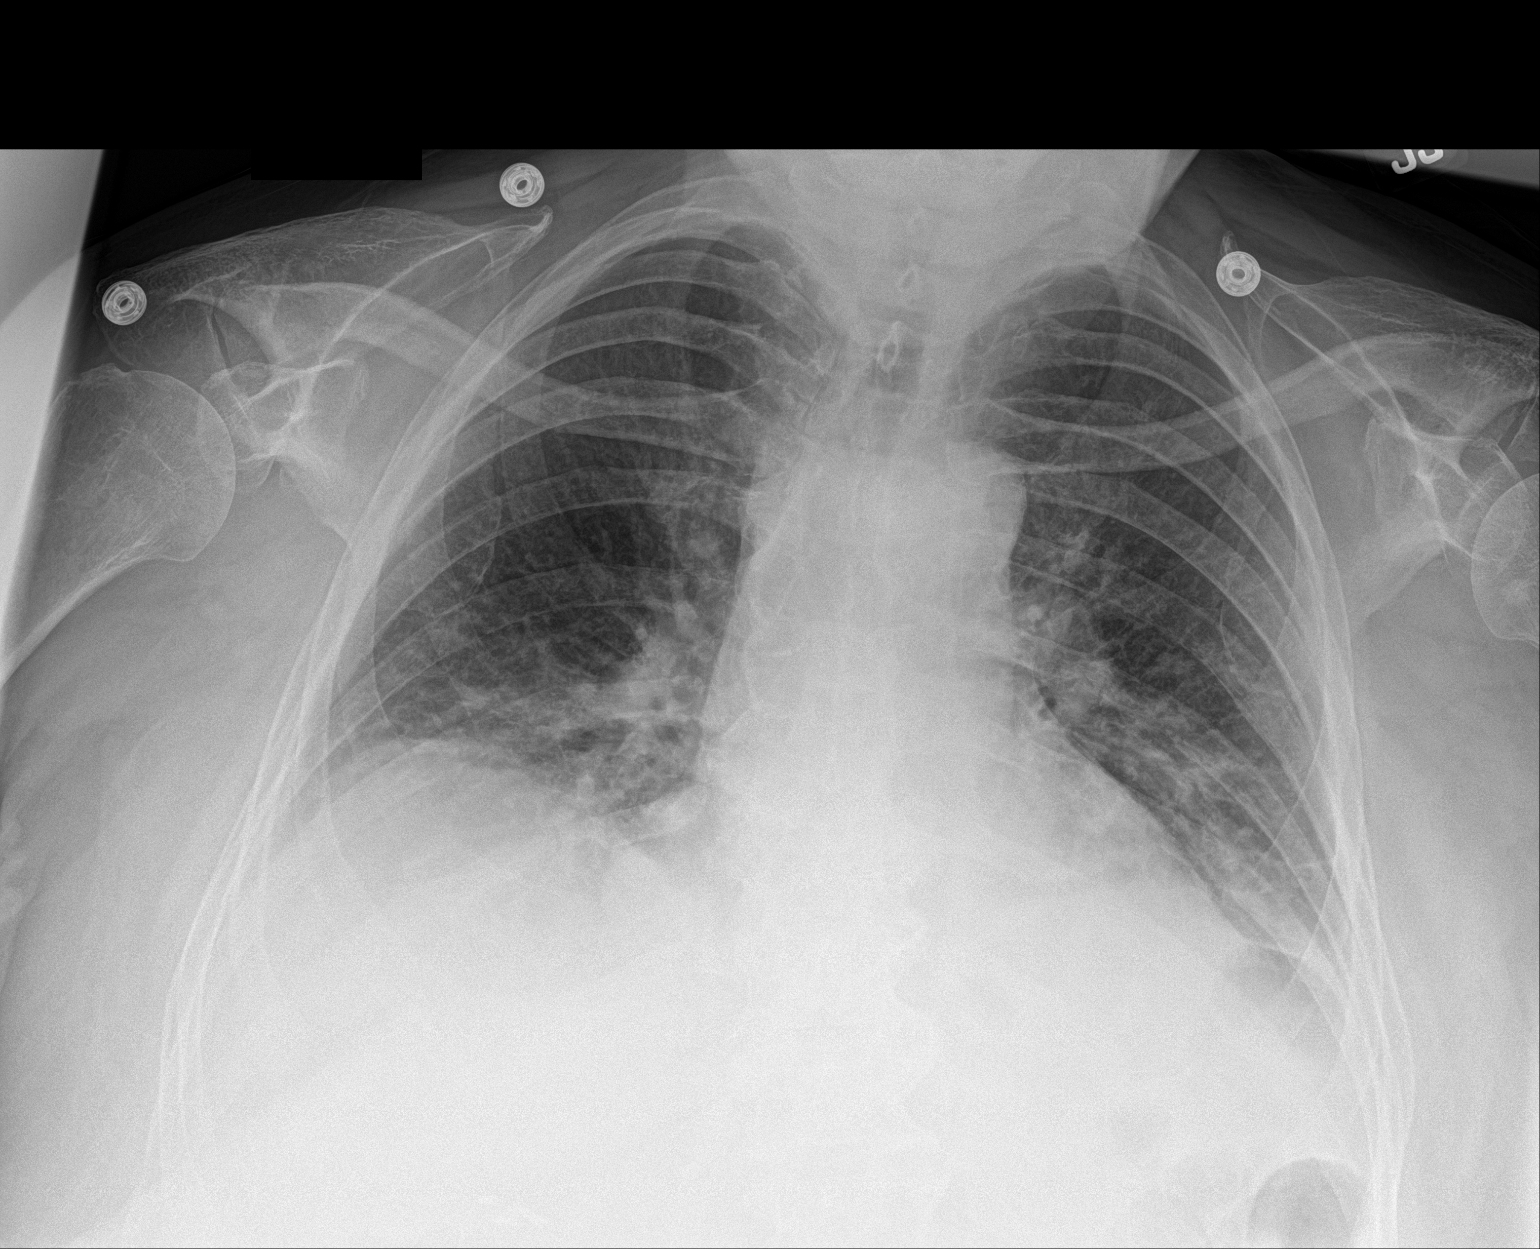

[1 of 1 positions shown; findings below may reference images not displayed]

FINDINGS: [QW] hours. Low volumes. Cardiopericardial silhouette is at upper
limits of normal for size. Vascular congestion noted with diffuse
interstitial opacities suggesting edema. There is bibasilar
atelectasis or infiltrate. No substantial pleural effusion. Bones
are diffusely demineralized.
IMPRESSION: 1. Vascular congestion with probable interstitial edema.
2. Bibasilar atelectasis or infiltrate.

## 2020-03-26 MED ORDER — BISACODYL 10 MG RE SUPP
10.0000 mg | Freq: Once | RECTAL | Status: AC
  Administered 2020-03-26: 10 mg via RECTAL
  Filled 2020-03-26: qty 1

## 2020-03-26 NOTE — Progress Notes (Signed)
   Trauma/Critical Care Follow Up Note  Subjective:    Overnight Issues:  Still no flatus.  Had issues with g tube clamping.  Got nauseated and had to have it opened back to drain.  Low grade temp overnight.    Objective:  Vital signs for last 24 hours: Temp:  [98.4 F (36.9 C)-100.8 F (38.2 C)] 98.4 F (36.9 C) (05/05 0942) Pulse Rate:  [82-100] 98 (05/05 0942) Resp:  [12-18] 14 (05/05 0942) BP: (109-146)/(66-81) 140/73 (05/05 0942) SpO2:  [93 %-98 %] 97 % (05/05 0942)    Intake/Output from previous day: 05/04 0701 - 05/05 0700 In: 1862 [I.V.:1581.7; NG/GT:106; IV Piggyback:129.3] Out: 2380 [Urine:520; Drains:1860]  Intake/Output this shift: Total I/O In: -  Out: 1250 [Urine:600; Drains:650]  Physical Exam:  Gen: comfortable, no distress Neuro: non-focal exam.  Alert. Oriented.  Joking around.   HEENT: PERRL Neck: supple CV: RRR Pulm: unlabored breathing Abd: soft, NT, incision c/d/i with staples, JP x2, both drains serosang.  J tube working. GU: condom cath.  Extr: wwp, no edema   Results for orders placed or performed during the hospital encounter of 03/20/20 (from the past 24 hour(s))  Glucose, capillary     Status: Abnormal   Collection Time: 03/25/20 11:48 AM  Result Value Ref Range   Glucose-Capillary 105 (H) 70 - 99 mg/dL  Glucose, capillary     Status: Abnormal   Collection Time: 03/25/20  4:00 PM  Result Value Ref Range   Glucose-Capillary 108 (H) 70 - 99 mg/dL  Glucose, capillary     Status: Abnormal   Collection Time: 03/25/20  7:55 PM  Result Value Ref Range   Glucose-Capillary 107 (H) 70 - 99 mg/dL   Comment 1 Notify RN    Comment 2 Document in Chart   Glucose, capillary     Status: Abnormal   Collection Time: 03/26/20 12:06 AM  Result Value Ref Range   Glucose-Capillary 105 (H) 70 - 99 mg/dL  Magnesium     Status: Abnormal   Collection Time: 03/26/20  3:10 AM  Result Value Ref Range   Magnesium 1.5 (L) 1.7 - 2.4 mg/dL  Glucose, capillary      Status: Abnormal   Collection Time: 03/26/20  4:04 AM  Result Value Ref Range   Glucose-Capillary 105 (H) 70 - 99 mg/dL  Glucose, capillary     Status: Abnormal   Collection Time: 03/26/20  7:49 AM  Result Value Ref Range   Glucose-Capillary 107 (H) 70 - 99 mg/dL    Assessment & Plan:  Present on Admission: . 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  unable to take PO meds.   Suppository today.   Sips and chips today.  Increase tube feeds. Hold clamping trials today.    Will hold on increasing tube feeds while there is no flatus.    CXR and u/a given low grade temps.    ABL anemia - monitor    Milus Height, MD FACS Surgical Oncology, General Surgery, Trauma and [place] [facility], [place] for weekday/non holidays Check [email or web address] for coverage night/weekend/holidays  Do not use SecureChat as it is not reliable for timely patient care.

## 2020-03-26 NOTE — Progress Notes (Signed)
Physical Therapy Treatment Patient Details Name: Albert Hartman MRN: [id number] DOB: [birth year] Today's Date: 03/26/2020    History of Present Illness Pt is a 74 y.o. male admitted 03/20/20 with unintentional weight loss, decreased appetite and nausea/vomiting. Found to have duodenal obstruction suspecisious for cancer. S/p diagnostic laparoscopy, resection of 3rd/4th portion of duodenum, gastrojejunostomy, GJ tube, pyloroplasty 4/29. Pt with duodenal obstruction and lymphadenopathy, awaiting pathology. PMH includes HTN, OSA, reflux.   PT Comments    Pt progressing well with mobility, remains motivated to participate despite abdominal pain. Able to perform bed mobility and ambulation with RW and min guard; declined gait training without DME. Wife present and supportive. SpO2 >/90% on RA; HR up to 131 while walking. Will continued to follow acutely.    Follow Up Recommendations  Home health PT;Supervision for mobility/OOB     Equipment Recommendations  None recommended by PT    Recommendations for Other Services       Precautions / Restrictions Precautions Precautions: Fall;Other (comment) Precaution Comments: Multiple drains/tubes (gastrostomy, GJ tube, RLQ drain, LLQ drain), condom cath, PCA pump; abdominal precautions for comfort Restrictions Weight Bearing Restrictions: No    Mobility  Bed Mobility Overal bed mobility: Needs Assistance Bed Mobility: Supine to Sit     Supine to sit: Supervision;HOB elevated     General bed mobility comments: Declined practicing bed mobility with flat bed this session; able to come to sitting with bed rail at supervision-level, increased time and effort  Transfers Overall transfer level: Needs assistance Equipment used: Rolling walker (2 wheeled) Transfers: Sit to/from Stand Sit to Stand: Min guard;From elevated surface         General transfer comment: Cues for hand placement, but pt preferring to pull on RW; willing to attempt with  single UE support on bed rail, min guard for balance  Ambulation/Gait Ambulation/Gait assistance: Min guard Gait Distance (Feet): 260 Feet Assistive device: Rolling walker (2 wheeled) Gait Pattern/deviations: Step-through pattern;Decreased stride length Gait velocity: Decreased Gait velocity interpretation: <1.8 ft/sec, indicate of risk for recurrent falls General Gait Details: Slow, steady gait with RW and min guard for balance; pt declined gait training with device. SpO2 94-96% on RA, HR up to 131   Stairs             Wheelchair Mobility    Modified Rankin (Stroke Patients Only)       Balance Overall balance assessment: Needs assistance Sitting-balance support: Feet supported;Bilateral upper extremity supported Sitting balance-Leahy Scale: Fair     Standing balance support: No upper extremity supported Standing balance-Leahy Scale: Fair Standing balance comment: Could static stand to don face mask without UE support and min guard                            Cognition Arousal/Alertness: Awake/alert Behavior During Therapy: WFL for tasks assessed/performed Overall Cognitive Status: Within Functional Limits for tasks assessed                                 General Comments: WFL for simple tasks; increased time to complete tasks      Exercises      General Comments General comments (skin integrity, edema, etc.): Wife present and supportive      Pertinent Vitals/Pain Pain Assessment: Faces Faces Pain Scale: Hurts even more Pain Location: abdomen Pain Descriptors / Indicators: Grimacing Pain Intervention(s): Monitored during session;PCA encouraged  Home Living                      Prior Function            PT Goals (current goals can now be found in the care plan section) Progress towards PT goals: Progressing toward goals    Frequency    Min 3X/week      PT Plan Current plan remains appropriate     Co-evaluation              AM-PAC PT "6 Clicks" Mobility   Outcome Measure  Help needed turning from your back to your side while in a flat bed without using bedrails?: A Little Help needed moving from lying on your back to sitting on the side of a flat bed without using bedrails?: A Little Help needed moving to and from a bed to a chair (including a wheelchair)?: A Little Help needed standing up from a chair using your arms (e.g., wheelchair or bedside chair)?: A Little Help needed to walk in hospital room?: A Little Help needed climbing 3-5 steps with a railing? : A Lot 6 Click Score: 17    End of Session   Activity Tolerance: Patient tolerated treatment well Patient left: in chair;with call bell/phone within reach;with family/visitor present Nurse Communication: Mobility status PT Visit Diagnosis: Other abnormalities of gait and mobility (R26.89);Pain;Muscle weakness (generalized) (M62.81)     Time: [id number] PT Time Calculation (min) (ACUTE ONLY): 33 min  Charges:  $Therapeutic Exercise: 8-22 mins $Therapeutic Activity: 8-22 mins                    Mabeline Caras, PT, DPT Acute Rehabilitation Services  Pager [phone] Office [place] 03/26/2020, 3:02 PM

## 2020-03-26 NOTE — Progress Notes (Signed)
Occupational Therapy Treatment Patient Details Name: Albert Hartman MRN: [id number] DOB: Feb 07, 1946 Today's Date: 03/26/2020    History of present illness Pt is a 74 yo M referred by Dr. Penelope Coop for duodenal obstruction. The patient presented with around a 50-70 lb weight loss since christmas.  He doesn't have much of an appetite.  He has periodically had severe episodes of throwing up. 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 Pt underwent Diagnostic laparoscopy, resection of 3rd, 4th portion of duodenum, gastrojejunostomy, GJ tube, pyloroplasty on 4/29. PMH includes HTN, sleep apnea, reflux.   OT comments  Pt making progress in therapy demonstrating improved activity tolerance and independence with self-care tasks. Pt demo good adherence to abdominal precautions, mobilizing EOB with use of bed rail and increased time. Pt tolerated sitting EOB ~10 min with supervision. Educated pt on safety strategies and activity pacing techniques to increase balance and prevent future falls with fair understanding. Educated pt on hand placement for sit/stand with RW with poor to fair understanding and follow through. Pt required assist to power up into standing and assist to steady during stand pivot to chair. Pt required cues for safety and body positioning during transfer. Pt open and receptive to learning about AE for LB dressing. Educated pt on use of reacher and sock aide with fair understanding and follow through. Pt required min cues on technique and increased time to complete tasks. Pt able to complete figure four position with BLEs and reports that he may have family assist with dressing as needed. All questions/concerns answered at this time. OT will continue to follow acutely.    Follow Up Recommendations  No OT follow up;Supervision - Intermittent    Equipment Recommendations  None recommended by OT    Recommendations for Other Services      Precautions / Restrictions  Precautions Precautions: Fall;Other (comment) Precaution Comments: multiple drains, G tube, epidural, PCA, abdominal precautions Restrictions Weight Bearing Restrictions: No       Mobility Bed Mobility Overal bed mobility: Needs Assistance Bed Mobility: Sidelying to Sit   Sidelying to sit: Min guard;HOB elevated       General bed mobility comments: Use of bed rail. Increased time needed to complete.   Transfers Overall transfer level: Needs assistance Equipment used: Rolling walker (2 wheeled) Transfers: Sit to/from [facility] Sit to Stand: Min assist Stand pivot transfers: Min assist       General transfer comment: Assist to power up into standing from elevated surface. Cues for hand placement. Assist for balance while pivoting over to chair. Cues to slow pace.     Balance Overall balance assessment: Needs assistance Sitting-balance support: Feet supported;Bilateral upper extremity supported Sitting balance-Leahy Scale: Fair       Standing balance-Leahy Scale: Fair                             ADL either performed or assessed with clinical judgement   ADL Overall ADL's : Needs assistance/impaired Eating/Feeding: NPO                   Lower Body Dressing: Supervision/safety;Set up;Sitting/lateral leans Lower Body Dressing Details (indicate cue type and reason): Educated pt on use of AE to complete LB dressing. Pt able to don/doff pants and doff socks with reacher as well as don socks with sock aide. Min cues throughout on technique. Increased time needed to complete. Difficulty placing sock on sock aide.  Functional mobility during ADLs: Rolling walker;Minimal assistance General ADL Comments: Pt tolerated sitting EOB ~10 min with supervision. Pt able to transfer to bedside chair with RW and min assist. Cues throughout for safety and body positioning.      Vision       Perception     Praxis      Cognition  Arousal/Alertness: Awake/alert Behavior During Therapy: WFL for tasks assessed/performed Overall Cognitive Status: Within Functional Limits for tasks assessed                                 General Comments: Pt pleasant and willing to participate in therapy tasks. Pt required increased time to complete tasks.         Exercises     Shoulder Instructions       General Comments      Pertinent Vitals/ Pain       Pain Assessment: 0-10 Pain Score: 9  Pain Location: abdomen Pain Descriptors / Indicators: Grimacing Pain Intervention(s): Limited activity within patient's tolerance;Monitored during session;Repositioned(0/10 pain at rest. 9/10 with movement)  Home Living                                          Prior Functioning/Environment              Frequency           Progress Toward Goals  OT Goals(current goals can now be found in the care plan section)  Progress towards OT goals: Progressing toward goals  ADL Goals Pt Will Perform Grooming: with modified independence;standing Pt Will Perform Lower Body Dressing: with modified independence;sit to/from stand Pt Will Transfer to Toilet: with modified independence;ambulating;bedside commode Pt Will Perform Toileting - Clothing Manipulation and hygiene: with modified independence;sit to/from stand;sitting/lateral leans  Plan Discharge plan remains appropriate    Co-evaluation                 AM-PAC OT "6 Clicks" Daily Activity     Outcome Measure   Help from another person eating meals?: Total Help from another person taking care of personal grooming?: A Little Help from another person toileting, which includes using toliet, bedpan, or urinal?: A Little Help from another person bathing (including washing, rinsing, drying)?: A Little Help from another person to put on and taking off regular upper body clothing?: A Little Help from another person to put on and taking off  regular lower body clothing?: A Little 6 Click Score: 16    End of Session Equipment Utilized During Treatment: Rolling walker  OT Visit Diagnosis: Unsteadiness on feet (R26.81);Other abnormalities of gait and mobility (R26.89);Muscle weakness (generalized) (M62.81);Pain Pain - Right/Left: (anterior) Pain - part of body: (abdomen)   Activity Tolerance Patient limited by pain;Patient limited by fatigue   Patient Left in chair;with call bell/phone within reach;with family/visitor present   Nurse Communication Mobility status        Time: [id number] OT Time Calculation (min): 40 min  Charges: OT General Charges $OT Visit: 1 Visit OT Treatments $Self Care/Home Management : 8-22 mins $Therapeutic Activity: 23-37 mins  Mauri Brooklyn OTR/L [phone]   Mauri Brooklyn 03/26/2020, 9:32 AM

## 2020-03-26 NOTE — Progress Notes (Addendum)
Nutrition Follow-up  DOCUMENTATION CODES:   Severe malnutrition in context of chronic illness  INTERVENTION:   Supplement diet once advanced  Continue Pivot 1.5 @ 30 ml/hr via j-tube  As able recommend advance to goal rate Pivot 1.5 @ 60 ml/hr   Provides: 2340 kcal, 146 grams protein, and 1184 ml free water.   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  Ongoing  GOAL:   Patient will meet greater than or equal to 90% of their needs  Progressing   MONITOR:   I & O's  REASON FOR ASSESSMENT:   Rounds    ASSESSMENT:   Pt with PMH of HTN and GERD who was dx with duodenal mass/stricture now admitted for surgery now s/p resection of 3rd, 4th portion of duodenum, gastrojejunostomy, GJ tube placement.  Reviewed I/O's: -518 ml x 24 hours and +1.3 L since admission  UOP: 520 ml x 24 hours   Drain output: 1.9 L x 24 hours  Case discussed with RN, who reports pt tolerating TF and clamping trials well.   Spoke with pt, wife, and sister at bedside. Pt sleepy today- per pt wife, pt did not sleepy well due to IV pumps beeping (materials have been switched out). Pt reports he really wants to eat, and has been consuming water and ice chips without difficulty. He also complains of poor taste in mouth the returns in 15 minutes after consuming water, describing it as "dry fish".   Pt reports tolerating TF and clamping trials well. He reports a little nausea, but no vomiting or abdominal pain. Pt reports that he was able to tolerate g-tube clamp to 3 hours yesterday, but disappointed that he could not reach goa of 4 hours. Wife shares that tolerance of clamping trials have increased daily. Provided emotional support.   Pt wife with many questions regarding nutrition plan; assured that details would be arranged as pt approaches discharge.    Pivot 1.5 infusing via j-tube @ 30 ml/hr, which provides 1080 kcals, 68 grams protein, and 540 ml free water daily, meeting 47% of estimated kcal needs and 57% of estimated protein needs. Pt with a G-J tube with 3 ports but unable to give any medications/prostat through the tube.   Labs reviewed: Mg: 1.6, CBGS: 101-107.   Diet Order:   Diet Order            Diet NPO time specified Except for: Ice Chips, Other (See Comments)  Diet effective now              EDUCATION NEEDS:   No education needs have been identified at this time  Skin:  Skin Assessment: Reviewed RN Assessment  Last BM:  Unknown  Height:   Ht Readings from Last 1 Encounters:  03/20/20 6' 1.5" (1.867 m)    Weight:   Wt Readings from Last 1 Encounters:  03/20/20 98.3 kg    Ideal Body Weight:  86.3 kg  BMI:  Body mass index is 28.2 kg/m.  Estimated Nutritional Needs:   Kcal:  2300-2500  Protein:  120-145 grams  Fluid:  >2 L/day    Loistine Chance, RD, LDN, CDCES Registered Dietitian II Certified Diabetes Care and Education Specialist Please refer to [facility] for RD and/or RD on-call/weekend/after hours pager

## 2020-03-27 DIAGNOSIS — E43 Unspecified severe protein-calorie malnutrition: Secondary | ICD-10-CM | POA: Insufficient documentation

## 2020-03-27 LAB — TYPE AND SCREEN
ABO/RH(D): O POS
Antibody Screen: NEGATIVE
Unit division: 0
Unit division: 0
Unit division: 0
Unit division: 0

## 2020-03-27 LAB — BPAM RBC
Blood Product Expiration Date: 202105272359
Blood Product Expiration Date: 202105282359
Blood Product Expiration Date: 202105282359
Blood Product Expiration Date: 202105282359
ISSUE DATE / TIME: 202104221129
ISSUE DATE / TIME: 202104301429
Unit Type and Rh: 5100
Unit Type and Rh: 5100
Unit Type and Rh: 5100
Unit Type and Rh: 5100

## 2020-03-27 LAB — URINALYSIS, COMPLETE (UACMP) WITH MICROSCOPIC
Bilirubin Urine: NEGATIVE
Glucose, UA: NEGATIVE mg/dL
Hgb urine dipstick: NEGATIVE
Ketones, ur: NEGATIVE mg/dL
Leukocytes,Ua: NEGATIVE
Nitrite: NEGATIVE
Protein, ur: NEGATIVE mg/dL
Specific Gravity, Urine: 1.026 (ref 1.005–1.030)
pH: 5 (ref 5.0–8.0)

## 2020-03-27 LAB — GLUCOSE, CAPILLARY
Glucose-Capillary: 115 mg/dL — ABNORMAL HIGH (ref 70–99)
Glucose-Capillary: 92 mg/dL (ref 70–99)
Glucose-Capillary: 94 mg/dL (ref 70–99)
Glucose-Capillary: 94 mg/dL (ref 70–99)
Glucose-Capillary: 96 mg/dL (ref 70–99)
Glucose-Capillary: 99 mg/dL (ref 70–99)

## 2020-03-27 MED ORDER — PIVOT 1.5 CAL PO LIQD
1000.0000 mL | ORAL | Status: DC
  Administered 2020-03-27: 21:00:00 1000 mL
  Filled 2020-03-27 (×2): qty 1000

## 2020-03-27 MED ORDER — MORPHINE SULFATE (PF) 2 MG/ML IV SOLN: 1.0000 mg | INTRAVENOUS | Status: DC | PRN

## 2020-03-27 MED ORDER — SODIUM CHLORIDE 0.9% FLUSH
10.0000 mL | INTRAVENOUS | Status: DC | PRN
  Administered 2020-04-01: 04:00:00 10 mL

## 2020-03-27 MED ORDER — SODIUM CHLORIDE 0.9 % IV BOLUS
1000.0000 mL | Freq: Once | INTRAVENOUS | Status: AC
  Administered 2020-03-27: 18:00:00 1000 mL via INTRAVENOUS

## 2020-03-27 MED ORDER — SODIUM CHLORIDE 0.9% FLUSH
10.0000 mL | Freq: Two times a day (BID) | INTRAVENOUS | Status: DC
  Administered 2020-03-29 – 2020-04-03 (×6): 10 mL

## 2020-03-27 NOTE — Progress Notes (Signed)
Decreased urine output noted, bladder scan showing zero urine in the bladder, MD on call made aware.new order given.

## 2020-03-27 NOTE — Progress Notes (Signed)
Subjective:    Overnight Issues:  Had flatus and BMs.    Objective:  Vital signs for last 24 hours: Temp:  [98.2 F (36.8 C)-98.7 F (37.1 C)] 98.2 F (36.8 C) (05/06 1426) Pulse Rate:  [82-89] 88 (05/06 1426) Resp:  [11-21] 19 (05/06 0808) BP: (115-132)/(65-76) 115/74 (05/06 1426) SpO2:  [93 %-96 %] 94 % (05/06 1426) Weight:  [96.1 kg] 96.1 kg (05/06 0630)    Intake/Output from previous day: 05/05 0701 - 05/06 0700 In: 1437.4 [I.V.:1437.4] Out: 2975 [Urine:900; Drains:2075]  Intake/Output this shift: Total I/O In: -  Out: 900 [Drains:900]  Physical Exam:  Gen: comfortable, no distress Neuro: non-focal exam.  Alert. Oriented.  Joking around.   HEENT: PERRL Neck: supple CV: RRR Pulm: unlabored breathing Abd: soft, NT, incision c/d/i with staples, JP x2, both drains serosang.  J tube working. GU: condom cath.  Extr: wwp, no edema   Results for orders placed or performed during the hospital encounter of 03/20/20 (from the past 24 hour(s))  Glucose, capillary     Status: None   Collection Time: 03/26/20  8:21 PM  Result Value Ref Range   Glucose-Capillary 96 70 - 99 mg/dL  Glucose, capillary     Status: None   Collection Time: 03/27/20 12:00 AM  Result Value Ref Range   Glucose-Capillary 99 70 - 99 mg/dL  Urinalysis, Complete w Microscopic     Status: Abnormal   Collection Time: 03/27/20  3:54 AM  Result Value Ref Range   Color, Urine AMBER (A) YELLOW   APPearance HAZY (A) CLEAR   Specific Gravity, Urine 1.026 1.005 - 1.030   pH 5.0 5.0 - 8.0   Glucose, UA NEGATIVE NEGATIVE mg/dL   Hgb urine dipstick NEGATIVE NEGATIVE   Bilirubin Urine NEGATIVE NEGATIVE   Ketones, ur NEGATIVE NEGATIVE mg/dL   Protein, ur NEGATIVE NEGATIVE mg/dL   Nitrite NEGATIVE NEGATIVE   Leukocytes,Ua NEGATIVE NEGATIVE   RBC / HPF 0-5 0 - 5 RBC/hpf   WBC, UA 0-5 0 - 5 WBC/hpf   Bacteria, UA RARE (A) NONE SEEN   Squamous Epithelial / LPF 0-5 0 - 5   Mucus PRESENT    Hyaline  Casts, UA PRESENT    Ca Oxalate Crys, UA PRESENT   Glucose, capillary     Status: None   Collection Time: 03/27/20  4:13 AM  Result Value Ref Range   Glucose-Capillary 94 70 - 99 mg/dL  Glucose, capillary     Status: None   Collection Time: 03/27/20  8:08 AM  Result Value Ref Range   Glucose-Capillary 92 70 - 99 mg/dL  Glucose, capillary     Status: Abnormal   Collection Time: 03/27/20 11:56 AM  Result Value Ref Range   Glucose-Capillary 115 (H) 70 - 99 mg/dL    Assessment & Plan:  Present on Admission: . 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   Clamping trials on g tube again.    ABL anemia - monitor    Milus Height, MD FACS Surgical Oncology, General Surgery, Trauma and [place] Surgery, [place] for weekday/non holidays Check [email or web address] for coverage night/weekend/holidays  Do not use SecureChat as it is not reliable for  timely patient care.

## 2020-03-27 NOTE — Progress Notes (Signed)
Nutrition Follow-up  DOCUMENTATION CODES:   Severe malnutrition in context of chronic illness  INTERVENTION:   Supplement diet once advanced  Continue Pivot 1.5 @ 40 ml/hr via j-tube  As able recommend advance to goal rate Pivot 1.5 @60  ml/hr   Provides:2340kcal, 146grams protein, and 1191ml free water.   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  Ongoing  GOAL:   Patient will meet greater than or equal to 90% of their needs  Progressing  MONITOR:   I & O's  REASON FOR ASSESSMENT:   Rounds    ASSESSMENT:   Pt with PMH of HTN and GERD who was dx with duodenal mass/stricture now admitted for surgery now s/p resection of 3rd, 4th portion of duodenum, gastrojejunostomy, GJ tube placement.  Reviewed I/O's: -1.5 L x 24 hours and -234 ml since admission  UOP: 900 ml x 24 hours  Drain output: 2.1 L x 24 hours  Pt receiving nursing care at time of visit.   Case discussed with MD and RNCM; pt will require TF at home. He remains on clamping trials. TF increased today by MD.  Pivot 1.5 infusing via j-tube @ 40 ml/hr, which provides 1440 kcals, 90 grams protein, and 720 ml free water daily, meeting 63% of estimated kcal needs and 48% of estimated protein needs. Pt with a G-J tube with 3 ports but unable to give any medications/prostat through the tube.   Medications reviewed and include dextrose 5%-0.9% sodium chloride infusion @ 60 ml/hr.   Labs reviewed: CBGS: 92-115.   Diet Order:   Diet Order            Diet NPO time specified Except for: Ice Chips, Other (See Comments)  Diet effective now              EDUCATION NEEDS:   No education needs have been identified at this time  Skin:  Skin Assessment: Skin Integrity Issues: Skin Integrity Issues:: Incisions Incisions: closed abdomen  Last BM:   03/27/20  Height:   Ht Readings from Last 1 Encounters:  03/20/20 6' 1.5" (1.867 m)    Weight:   Wt Readings from Last 1 Encounters:  03/27/20 96.1 kg    Ideal Body Weight:  86.3 kg  BMI:  Body mass index is 27.57 kg/m.  Estimated Nutritional Needs:   Kcal:  2300-2500  Protein:  120-145 grams  Fluid:  >2 L/day    Loistine Chance, RD, LDN, CDCES Registered Dietitian II Certified Diabetes Care and Education Specialist Please refer to [facility] for RD and/or RD on-call/weekend/after hours pager

## 2020-03-28 LAB — BASIC METABOLIC PANEL
Anion gap: 10 (ref 5–15)
BUN: 18 mg/dL (ref 8–23)
CO2: 24 mmol/L (ref 22–32)
Calcium: 8.4 mg/dL — ABNORMAL LOW (ref 8.9–10.3)
Chloride: 103 mmol/L (ref 98–111)
Creatinine, Ser: 0.55 mg/dL — ABNORMAL LOW (ref 0.61–1.24)
GFR calc Af Amer: >60 mL/min (ref >60)
GFR calc non Af Amer: >60 mL/min (ref >60)
Glucose, Bld: 112 mg/dL — ABNORMAL HIGH (ref 70–99)
Potassium: 3.2 mmol/L — ABNORMAL LOW (ref 3.5–5.1)
Sodium: 137 mmol/L (ref 135–145)

## 2020-03-28 LAB — GLUCOSE, CAPILLARY
Glucose-Capillary: 103 mg/dL — ABNORMAL HIGH (ref 70–99)
Glucose-Capillary: 104 mg/dL — ABNORMAL HIGH (ref 70–99)
Glucose-Capillary: 111 mg/dL — ABNORMAL HIGH (ref 70–99)
Glucose-Capillary: 113 mg/dL — ABNORMAL HIGH (ref 70–99)
Glucose-Capillary: 97 mg/dL (ref 70–99)
Glucose-Capillary: 99 mg/dL (ref 70–99)

## 2020-03-28 LAB — CBC
HCT: 32.1 % — ABNORMAL LOW (ref 39.0–52.0)
Hemoglobin: 10.4 g/dL — ABNORMAL LOW (ref 13.0–17.0)
MCH: 29.7 pg (ref 26.0–34.0)
MCHC: 32.4 g/dL (ref 30.0–36.0)
MCV: 91.7 fL (ref 80.0–100.0)
Platelets: 420 10*3/uL — ABNORMAL HIGH (ref 150–400)
RBC: 3.5 MIL/uL — ABNORMAL LOW (ref 4.22–5.81)
RDW: 15.4 % (ref 11.5–15.5)
WBC: 4.9 10*3/uL (ref 4.0–10.5)
nRBC: 0 % (ref 0.0–0.2)

## 2020-03-28 MED ORDER — BOOST / RESOURCE BREEZE PO LIQD CUSTOM
1.0000 | Freq: Three times a day (TID) | ORAL | Status: DC
  Administered 2020-03-28 – 2020-04-03 (×12): 1 via ORAL

## 2020-03-28 MED ORDER — PIVOT 1.5 CAL PO LIQD
1000.0000 mL | ORAL | Status: DC
  Administered 2020-03-28 – 2020-04-02 (×5): 1000 mL
  Filled 2020-03-28 (×9): qty 1000

## 2020-03-28 MED ORDER — PIVOT 1.5 CAL PO LIQD
1000.0000 mL | ORAL | Status: DC
  Filled 2020-03-28: qty 1000

## 2020-03-28 NOTE — Progress Notes (Signed)
Subjective:    Overnight Issues:  Had flatus and BMs.    Objective:  Vital signs for last 24 hours: Temp:  [98.2 F (36.8 C)-98.9 F (37.2 C)] 98.6 F (37 C) (05/07 0438) Pulse Rate:  [68-88] 68 (05/07 0438) Resp:  [18] 18 (05/07 0438) BP: (115-134)/(65-74) 126/65 (05/07 0438) SpO2:  [94 %-98 %] 98 % (05/07 0438)    Intake/Output from previous day: 05/06 0701 - 05/07 0700 In: 1200 [I.V.:720; NG/GT:480] Out: 3670 [Urine:450; Drains:3220]  Intake/Output this shift: No intake/output data recorded.  Physical Exam:  Gen: comfortable, no distress Neuro: non-focal exam.  Alert. Oriented.  Joking around.   HEENT: PERRL Neck: supple CV: RRR Pulm: unlabored breathing Abd: soft, NT, incision c/d/i with staples, JP x2, both drains serosang.  J tube working. GU: condom cath.  Extr: wwp, no edema   Results for orders placed or performed during the hospital encounter of 03/20/20 (from the past 24 hour(s))  Glucose, capillary     Status: None   Collection Time: 03/27/20  4:59 PM  Result Value Ref Range   Glucose-Capillary 96 70 - 99 mg/dL  Glucose, capillary     Status: None   Collection Time: 03/27/20  8:27 PM  Result Value Ref Range   Glucose-Capillary 94 70 - 99 mg/dL   Comment 1 Notify RN    Comment 2 Document in Chart   Glucose, capillary     Status: Abnormal   Collection Time: 03/28/20 12:07 AM  Result Value Ref Range   Glucose-Capillary 103 (H) 70 - 99 mg/dL   Comment 1 Notify RN    Comment 2 Document in Chart   CBC     Status: Abnormal   Collection Time: 03/28/20  2:51 AM  Result Value Ref Range   WBC 4.9 4.0 - 10.5 K/uL   RBC 3.50 (L) 4.22 - 5.81 MIL/uL   Hemoglobin 10.4 (L) 13.0 - 17.0 g/dL   HCT 32.1 (L) 39.0 - 52.0 %   MCV 91.7 80.0 - 100.0 fL   MCH 29.7 26.0 - 34.0 pg   MCHC 32.4 30.0 - 36.0 g/dL   RDW 15.4 11.5 - 15.5 %   Platelets 420 (H) 150 - 400 K/uL   nRBC 0.0 0.0 - 0.2 %  Basic metabolic panel     Status: Abnormal   Collection Time:  03/28/20  2:51 AM  Result Value Ref Range   Sodium 137 135 - 145 mmol/L   Potassium 3.2 (L) 3.5 - 5.1 mmol/L   Chloride 103 98 - 111 mmol/L   CO2 24 22 - 32 mmol/L   Glucose, Bld 112 (H) 70 - 99 mg/dL   BUN 18 8 - 23 mg/dL   Creatinine, Ser 0.55 (L) 0.61 - 1.24 mg/dL   Calcium 8.4 (L) 8.9 - 10.3 mg/dL   GFR calc non Af Amer >60 >60 mL/min   GFR calc Af Amer >60 >60 mL/min   Anion gap 10 5 - 15  Glucose, capillary     Status: Abnormal   Collection Time: 03/28/20  4:35 AM  Result Value Ref Range   Glucose-Capillary 111 (H) 70 - 99 mg/dL  Glucose, capillary     Status: None   Collection Time: 03/28/20  8:04 AM  Result Value Ref Range   Glucose-Capillary 99 70 - 99 mg/dL  Glucose, capillary     Status: Abnormal   Collection Time: 03/28/20 11:48 AM  Result Value Ref Range   Glucose-Capillary 104 (H) 70 -  99 mg/dL    Assessment & Plan:  Present on Admission: . 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    Increase clamping time to 4 hours clamped, 1 hour unclamped.  Can have clears.    ABL anemia - monitor    Milus Height, MD FACS Surgical Oncology, General Surgery, Trauma and [place] [facility], [place] for weekday/non holidays Check [email or web address] for coverage night/weekend/holidays  Do not use SecureChat as it is not reliable for timely patient care.

## 2020-03-28 NOTE — Progress Notes (Signed)
Physical Therapy Treatment Patient Details Name: Albert Hartman MRN: [id number] DOB: 1946-11-19 Today's Date: 03/28/2020    History of Present Illness Pt is a 74 y.o. male admitted 03/20/20 with unintentional weight loss, decreased appetite and nausea/vomiting. Found to have duodenal obstruction suspecisious for cancer. S/p diagnostic laparoscopy, resection of 3rd/4th portion of duodenum, gastrojejunostomy, GJ tube, pyloroplasty 4/29. Pt with duodenal obstruction and lymphadenopathy, awaiting pathology. PMH includes HTN, OSA, reflux.    PT Comments    Pt sitting up in chair and agreeable to PT. Amb increased distance today with min guard assist RW. Taught Pt exercises to complete in sitting. Pt agreeable to exercises, stating he had done many of them before with mother. Educated Pt on the importance of completing exercises while sitting up and how they can assist with decreasing discomfort when sitting. Pt remains appropriate for physical therapy in the acute care setting to improve functional mobility and generalized weakness until d/c to next level of care.  Follow Up Recommendations  Home health PT;Supervision for mobility/OOB     Equipment Recommendations  None recommended by PT    Recommendations for Other Services       Precautions / Restrictions Precautions Precautions: Fall Precaution Comments: Multiple drains/tubes (gastrostomy, GJ tube, RLQ drain, LLQ drain), condom cath, abdominal precautions for comfort Restrictions Weight Bearing Restrictions: No    Mobility  Bed Mobility                  Transfers Overall transfer level: Needs assistance Equipment used: Rolling walker (2 wheeled) Transfers: Sit to/from Stand Sit to Stand: Min guard         General transfer comment: Pt demonstrates improvement by verbalizing and acknowledging no pulling on walker.  Ambulation/Gait Ambulation/Gait assistance: Min guard Gait Distance (Feet): 300 Feet Assistive device:  Rolling walker (2 wheeled) Gait Pattern/deviations: Step-through pattern;Decreased stride length Gait velocity: Decreased, 0.3386 m/s   General Gait Details: Pt gait speed increased   Stairs             Wheelchair Mobility    Modified Rankin (Stroke Patients Only)       Balance Overall balance assessment: Needs assistance Sitting-balance support: No upper extremity supported;Feet supported Sitting balance-Leahy Scale: Fair     Standing balance support: Bilateral upper extremity supported;During functional activity Standing balance-Leahy Scale: Fair                              Cognition Arousal/Alertness: Awake/alert Behavior During Therapy: WFL for tasks assessed/performed Overall Cognitive Status: Within Functional Limits for tasks assessed                                 General Comments: WFL for simple tasks; increased time to complete tasks due to pain and lines      Exercises General Exercises - Lower Extremity Ankle Circles/Pumps: AROM;Both;Seated;20 reps(2x10) Long Arc Quad: AROM;Seated;Both;Other reps (comment)(1x10, 1x20) Hip Flexion/Marching: Other reps (comment);AROM;Both;Seated(1x10, 1x25) Mini-Sqauts: AROM;Standing;5 reps    General Comments General comments (skin integrity, edema, etc.): Lines, leads and tubes observed and intact. Pt motivated to complete physical therapy. Reports liking to drive RV across country with wife, states next trip will be to [place].      Pertinent Vitals/Pain Pain Score: 3  Pain Location: abdomen Pain Descriptors / Indicators: Grimacing;Discomfort Pain Intervention(s): Limited activity within patient's tolerance;Monitored during session;Repositioned    Home Living  Prior Function            PT Goals (current goals can now be found in the care plan section) Acute Rehab PT Goals Patient Stated Goal: To return to independence PT Goal Formulation: With  patient Time For Goal Achievement: 04/04/20 Potential to Achieve Goals: Good Additional Goals Additional Goal #1: Pt will maintain dynamic standing balance within 10 inches of his base of support without UE support and supervision Progress towards PT goals: Progressing toward goals    Frequency    Min 3X/week      PT Plan Current plan remains appropriate    Co-evaluation              AM-PAC PT "6 Clicks" Mobility   Outcome Measure  Help needed turning from your back to your side while in a flat bed without using bedrails?: A Little Help needed moving from lying on your back to sitting on the side of a flat bed without using bedrails?: A Little Help needed moving to and from a bed to a chair (including a wheelchair)?: A Little Help needed standing up from a chair using your arms (e.g., wheelchair or bedside chair)?: A Little Help needed to walk in hospital room?: A Little Help needed climbing 3-5 steps with a railing? : A Lot 6 Click Score: 17    End of Session Equipment Utilized During Treatment: Gait belt Activity Tolerance: Patient tolerated treatment well Patient left: in chair;with call bell/phone within reach;with nursing/sitter in room Nurse Communication: Mobility status PT Visit Diagnosis: Other abnormalities of gait and mobility (R26.89);Pain;Muscle weakness (generalized) (M62.81)     Time: [id number] PT Time Calculation (min) (ACUTE ONLY): 28 min  Charges:  $Gait Training: 8-22 mins $Therapeutic Exercise: 8-22 mins                     [employer] SPT 03/28/2020    Rolland Porter 03/28/2020, 5:41 PM

## 2020-03-28 NOTE — TOC Initial Note (Signed)
Transition of Care [facility]) - Initial/Assessment Note    Patient Details  Name: Albert Hartman MRN: [id number] Date of Birth: 08-06-1946  Transition of Care [facility]) CM/SW Contact:    Marilu Favre, RN Phone Number: 03/28/2020, 4:42 PM  Clinical Narrative:                 Patient from home with mother and wife.   Confirmed face sheet information. Explained prior to discharge he and his wife will be taught tube feedings , wound care etc. He will have HHPT and RN but will not be able to make daily visits.  PAtient voiced understanding and states his wife is a retired Marine scientist. Provided medicare.gov list for patient and wife to review. Made referral to [facility] with [facility] for tube feedings  Expected Discharge Plan: [place] Barriers to Discharge: Continued Medical Work up   Patient Goals and CMS Choice Patient states their goals for this hospitalization and ongoing recovery are:: to return to home CMS Medicare.gov Compare Post Acute Care list provided to:: Patient Choice offered to / list presented to : Patient  Expected Discharge Plan and Services Expected Discharge Plan: [place]   Discharge Planning Services: CM Consult Post Acute Care Choice: [place] arrangements for the past 2 months: Single Family Home                           HH Arranged: PT, RN          Prior Living Arrangements/Services Living arrangements for the past 2 months: Single Family Home Lives with:: Parents, Spouse Patient language and need for interpreter reviewed:: Yes Do you feel safe going back to the place where you live?: Yes      Need for Family Participation in Patient Care: Yes (Comment) Care giver support system in place?: Yes (comment)   Criminal Activity/Legal Involvement Pertinent to Current Situation/Hospitalization: No - Comment as needed  Activities of Daily Living Home Assistive Devices/Equipment: Eyeglasses, Blood pressure  cuff ADL Screening (condition at time of admission) Patient's cognitive ability adequate to safely complete daily activities?: Yes Is the patient deaf or have difficulty hearing?: No Does the patient have difficulty seeing, even when wearing glasses/contacts?: No Does the patient have difficulty concentrating, remembering, or making decisions?: No Patient able to express need for assistance with ADLs?: Yes Does the patient have difficulty dressing or bathing?: No Independently performs ADLs?: Yes (appropriate for developmental age) Does the patient have difficulty walking or climbing stairs?: No Weakness of Legs: Both Weakness of Arms/Hands: None  Permission Sought/Granted   Permission granted to share information with : No              Emotional Assessment Appearance:: Appears stated age Attitude/Demeanor/Rapport: Engaged Affect (typically observed): Accepting Orientation: : Oriented to Self, Oriented to Place, Oriented to  Time, Oriented to Situation Alcohol / Substance Use: Not Applicable Psych Involvement: No (comment)  Admission diagnosis:  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 Protein-calorie malnutrition, severe 03/27/2020  . Duodenal mass 03/20/2020  . 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

## 2020-03-28 NOTE — Progress Notes (Signed)
Patient puts on his own home CPAP.

## 2020-03-28 NOTE — Progress Notes (Signed)
Pt has home CPAP, would like machine to be set up for use tonight. Cords/machine inspected for frays/damage by this RT. Advised pt to notify for RT if any further assistance is needed.

## 2020-03-28 NOTE — Progress Notes (Addendum)
Nutrition Follow-up  DOCUMENTATION CODES:   Severe malnutrition in context of chronic illness  INTERVENTION:   -Boost Breeze po TID, each supplement provides 250 kcal and 9 grams of protein -ContinuePivot 1.5 @ 60 ml/hrvia j-tube  Continue with 30 ml free water flush every 4 hours for tube patency  Provides:2340kcal, 146grams protein, and 1133ml free water.Total free water: 1364 ml daily  NUTRITION DIAGNOSIS:   Severe Malnutrition related to chronic illness(bowel obstruction) as evidenced by energy intake < or equal to 75% for > or equal to 1 month, percent weight loss.  Ongoing  GOAL:   Patient will meet greater than or equal to 90% of their needs  Progressing   MONITOR:   I & O's  REASON FOR ASSESSMENT:   Rounds    ASSESSMENT:   Pt with PMH of HTN and GERD who was dx with duodenal mass/stricture now admitted for surgery now s/p resection of 3rd, 4th portion of duodenum, gastrojejunostomy, GJ tube placement.  Reviewed I/O's: -2.5 L x 24 hours and -2.7 L since admission  UOP: 450 ml x 24 hours  Drain out: 3.2 L x 24 hours  Pt receiving nursing care at time of visit.   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   Pt's TF advanced to goal today. Plan to increase clamping trials to 4 hours today. He was also advanced to clear liquids. Pt with a G-J tube with 3 ports but unable to give any medications/prostat through the tube.Due to this, pt will require a specialized formula in order to meet his nutritional needs, as he is unable to supplement with a protein modular.   Per MD notes, pt will require TF at home.   Medications reviewed and include dextrose 5%-0.9% sodium chloride infusion @ 60 ml/hr.   Labs reviewed: K: 3.2, CBGS: 99-111.  Diet Order:   Diet Order            Diet clear liquid Room service appropriate? Yes; Fluid consistency: Thin  Diet effective now              EDUCATION NEEDS:   No education needs have been identified at  this time  Skin:  Skin Assessment: Skin Integrity Issues: Skin Integrity Issues:: Incisions Incisions: closed abdomen  Last BM:  03/27/20  Height:   Ht Readings from Last 1 Encounters:  03/20/20 6' 1.5" (1.867 m)    Weight:   Wt Readings from Last 1 Encounters:  03/27/20 96.1 kg    Ideal Body Weight:  86.3 kg  BMI:  Body mass index is 27.57 kg/m.  Estimated Nutritional Needs:   Kcal:  2300-2500  Protein:  120-145 grams  Fluid:  >2 L/day    Loistine Chance, RD, LDN, CDCES Registered Dietitian II Certified Diabetes Care and Education Specialist Please refer to [facility] for RD and/or RD on-call/weekend/after hours pager

## 2020-03-29 LAB — GLUCOSE, CAPILLARY
Glucose-Capillary: 105 mg/dL — ABNORMAL HIGH (ref 70–99)
Glucose-Capillary: 105 mg/dL — ABNORMAL HIGH (ref 70–99)
Glucose-Capillary: 107 mg/dL — ABNORMAL HIGH (ref 70–99)
Glucose-Capillary: 113 mg/dL — ABNORMAL HIGH (ref 70–99)
Glucose-Capillary: 115 mg/dL — ABNORMAL HIGH (ref 70–99)
Glucose-Capillary: 96 mg/dL (ref 70–99)

## 2020-03-29 MED ORDER — ENOXAPARIN SODIUM 40 MG/0.4ML ~~LOC~~ SOLN
40.0000 mg | SUBCUTANEOUS | Status: DC
  Administered 2020-03-29 – 2020-04-07 (×10): 40 mg via SUBCUTANEOUS
  Filled 2020-03-29 (×10): qty 0.4

## 2020-03-29 NOTE — Progress Notes (Addendum)
Day shift nurse spoke with MD. Instructions passed on via day shift nurse, is to clamp g-tube for 4 hours then unclamp for 1 hour, total amount of output then divide total by 12 hours to get rate for normal saline fluid going into iv.  1945 - unclamped g-tube 2045 - clamped for 4 hours  (output was 900 ml) 0045 - unclamped 0145 - clamped  (output was 1200 ml) 0545 - unclamped 0641 - clamped  (output was 1100 ml)  Grand total: 900+1200+1100=3200 3200/12=266 ml/hr new rate for day shift 03/29/2020   Information however is not clear in orders.

## 2020-03-29 NOTE — Progress Notes (Addendum)
9 Days Post-Op  Subjective: CC: Doing well. Tolerating G-tube clamping trials. Some fullness on the 4th hour but releaved after unclamped. Drank 5-6 full cups of water yesterday. Passing flatus. Liquidy bm yesterday. Mobilizing. Producing urine.  Objective: Vital signs in last 24 hours: Temp:  [97.5 F (36.4 C)-98.3 F (36.8 C)] 97.8 F (36.6 C) (05/08 0407) Pulse Rate:  [77-90] 77 (05/08 0407) Resp:  [14-16] 14 (05/08 0407) BP: (118-141)/(75-76) 141/76 (05/08 0407) SpO2:  [98 %-100 %] 99 % (05/08 0407) FiO2 (%):  [21 %] 21 % (05/07 2100) Last BM Date: 03/28/20  Intake/Output from previous day: 05/07 0701 - 05/08 0700 In: 3883.9 [P.O.:480; I.V.:2493.9; NG/GT:910] Out: 5936 [Urine:775; Drains:5160; Stool:1] Intake/Output this shift: No intake/output data recorded.  PE: Gen: comfortable, no distress CV: RRR Pulm: normal rate and effort  Abd: soft, ND, NT, JP x2, both drains serosang. J tube clamped. 5160cc/24 hours. Midline incision c/d/i with staples but with noticed redness around staples and at base of wound. There is no heat, warmth or induration. Monitor closely. See picture below.  GU: external cath with dark urine in cannister Extr: wwp, no edema     Lab Results:  Recent Labs    03/28/20 0251  WBC 4.9  HGB 10.4*  HCT 32.1*  PLT 420*   BMET Recent Labs    03/28/20 0251  NA 137  K 3.2*  CL 103  CO2 24  GLUCOSE 112*  BUN 18  CREATININE 0.55*  CALCIUM 8.4*   PT/INR No results for input(s): LABPROT, INR in the last 72 hours. CMP     Component Value Date/Time   NA 137 03/28/2020 0251   K 3.2 (L) 03/28/2020 0251   CL 103 03/28/2020 0251   CO2 24 03/28/2020 0251   GLUCOSE 112 (H) 03/28/2020 0251   BUN 18 03/28/2020 0251   CREATININE 0.55 (L) 03/28/2020 0251   CALCIUM 8.4 (L) 03/28/2020 0251   PROT 4.6 (L) 03/25/2020 0621   ALBUMIN 2.4 (L) 03/25/2020 0621   AST 15 03/25/2020 0621   ALT 11 03/25/2020 0621   ALKPHOS 70 03/25/2020 0621    BILITOT 0.7 03/25/2020 0621   GFRNONAA >60 03/28/2020 0251   GFRAA >60 03/28/2020 0251   Lipase     Component Value Date/Time   LIPASE 15 03/12/2020 1104       Studies/Results: No results found.  Anti-infectives: Anti-infectives (From admission, onward)   Start     Dose/Rate Route Frequency Ordered Stop   03/20/20 1900  ciprofloxacin (CIPRO) IVPB 400 mg     400 mg 200 mL/hr over 60 Minutes Intravenous Every 12 hours 03/20/20 1829 03/20/20 2136   03/20/20 0600  ciprofloxacin (CIPRO) IVPB 400 mg     400 mg 200 mL/hr over 60 Minutes Intravenous On call to O.R. 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  Cont clears.  Will clarify replacement of losses with MD - Discuss redness around midline with MD - Hgb stable on yesterdays labs. Start Lovenox.  - Cont drains  - Mobilize. PT. Rec HH currently - IS  FEN: TF's, CLD, IVF VTE: SCD's, Lovenox ID: Cipro periop    LOS: 9 days  Jillyn Ledger , [facility] 03/29/2020, 8:31 AM Please see Amion for pager number during day hours 7:00am-4:30pm

## 2020-03-30 LAB — GLUCOSE, CAPILLARY
Glucose-Capillary: 100 mg/dL — ABNORMAL HIGH (ref 70–99)
Glucose-Capillary: 100 mg/dL — ABNORMAL HIGH (ref 70–99)
Glucose-Capillary: 104 mg/dL — ABNORMAL HIGH (ref 70–99)
Glucose-Capillary: 105 mg/dL — ABNORMAL HIGH (ref 70–99)
Glucose-Capillary: 95 mg/dL (ref 70–99)
Glucose-Capillary: 96 mg/dL (ref 70–99)
Glucose-Capillary: 98 mg/dL (ref 70–99)

## 2020-03-30 NOTE — Progress Notes (Signed)
    10 Days Post-Op  Subjective: CC: Doing well. Tolerating CLD and drinking "a lot". Notes he had several cups of water as well as a 48 ounce Coke yesterday.  Reports G-tube was unclamped yesterday.  He did not get out of bed yesterday.  Last BM yesterday.  Objective: Vital signs in last 24 hours: Temp:  [97.7 F (36.5 C)-98.2 F (36.8 C)] 97.7 F (36.5 C) (05/09 0420) Pulse Rate:  [78-87] 78 (05/09 0420) Resp:  [17-18] 17 (05/09 0420) BP: (134-139)/(67-72) 139/69 (05/09 0420) SpO2:  [100 %] 100 % (05/09 0420) Weight:  [93.4 kg] 93.4 kg (05/09 0420) Last BM Date: 03/29/20  Intake/Output from previous day: 05/08 0701 - 05/09 0700 In: 5119.1 [P.O.:2280; I.V.:2419.1; NG/GT:420] Out: 8900 [Urine:800; Drains:8100] Intake/Output this shift: No intake/output data recorded.  PE: Gen: comfortable, no distress CV: RRR Pulm: normal rate and effort  Abd: soft, ND, NT, JP x2, both drains serosang. J tube 8100cc/24 hours. Midline incision c/d/i with staples but with noticed redness around staples and at base of wound. There is no heat, warmth or induration. Stable from yesterday. Monitor closely.   GU: external cath  Extr: wwp, no edema  Lab Results:  Recent Labs    03/28/20 0251  WBC 4.9  HGB 10.4*  HCT 32.1*  PLT 420*   BMET Recent Labs    03/28/20 0251  NA 137  K 3.2*  CL 103  CO2 24  GLUCOSE 112*  BUN 18  CREATININE 0.55*  CALCIUM 8.4*   PT/INR No results for input(s): LABPROT, INR in the last 72 hours. CMP     Component Value Date/Time   NA 137 03/28/2020 0251   K 3.2 (L) 03/28/2020 0251   CL 103 03/28/2020 0251   CO2 24 03/28/2020 0251   GLUCOSE 112 (H) 03/28/2020 0251   BUN 18 03/28/2020 0251   CREATININE 0.55 (L) 03/28/2020 0251   CALCIUM 8.4 (L) 03/28/2020 0251   PROT 4.6 (L) 03/25/2020 0621   ALBUMIN 2.4 (L) 03/25/2020 0621   AST 15 03/25/2020 0621   ALT 11 03/25/2020 0621   ALKPHOS 70 03/25/2020 0621   BILITOT 0.7 03/25/2020 0621   GFRNONAA  >60 03/28/2020 0251   GFRAA >60 03/28/2020 0251   Lipase     Component Value Date/Time   LIPASE 15 03/12/2020 1104       Studies/Results: No results found.  Anti-infectives: Anti-infectives (From admission, onward)   Start     Dose/Rate Route Frequency Ordered Stop   03/20/20 1900  ciprofloxacin (CIPRO) IVPB 400 mg     400 mg 200 mL/hr over 60 Minutes Intravenous Every 12 hours 03/20/20 1829 03/20/20 2136   03/20/20 0600  ciprofloxacin (CIPRO) IVPB 400 mg     400 mg 200 mL/hr over 60 Minutes Intravenous On call to O.R. 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 PT. Rec HH currently - IS  FEN: TF's, CLD, IVF VTE: SCD's, Lovenox ID: Cipro periop   LOS: 10 days    Jillyn Ledger , [facility] 03/30/2020, 8:47 AM Please see Amion for pager number during day hours 7:00am-4:30pm

## 2020-03-30 NOTE — Progress Notes (Signed)
Patient stated he places himself on/off home CPAP unit without assistance. RT instructed patient to have RT called if needed. RT will monitor as needed.

## 2020-03-31 ENCOUNTER — Inpatient Hospital Stay (HOSPITAL_COMMUNITY): Payer: Medicare Other

## 2020-03-31 LAB — BASIC METABOLIC PANEL
Anion gap: 9 (ref 5–15)
BUN: 24 mg/dL — ABNORMAL HIGH (ref 8–23)
CO2: 22 mmol/L (ref 22–32)
Calcium: 8.8 mg/dL — ABNORMAL LOW (ref 8.9–10.3)
Chloride: 112 mmol/L — ABNORMAL HIGH (ref 98–111)
Creatinine, Ser: 0.52 mg/dL — ABNORMAL LOW (ref 0.61–1.24)
GFR calc Af Amer: >60 mL/min (ref >60)
GFR calc non Af Amer: >60 mL/min (ref >60)
Glucose, Bld: 129 mg/dL — ABNORMAL HIGH (ref 70–99)
Potassium: 3.3 mmol/L — ABNORMAL LOW (ref 3.5–5.1)
Sodium: 143 mmol/L (ref 135–145)

## 2020-03-31 LAB — CBC
HCT: 33 % — ABNORMAL LOW (ref 39.0–52.0)
Hemoglobin: 10.4 g/dL — ABNORMAL LOW (ref 13.0–17.0)
MCH: 29.7 pg (ref 26.0–34.0)
MCHC: 31.5 g/dL (ref 30.0–36.0)
MCV: 94.3 fL (ref 80.0–100.0)
Platelets: 614 10*3/uL — ABNORMAL HIGH (ref 150–400)
RBC: 3.5 MIL/uL — ABNORMAL LOW (ref 4.22–5.81)
RDW: 15.9 % — ABNORMAL HIGH (ref 11.5–15.5)
WBC: 14.5 10*3/uL — ABNORMAL HIGH (ref 4.0–10.5)
nRBC: 0 % (ref 0.0–0.2)

## 2020-03-31 LAB — GLUCOSE, CAPILLARY
Glucose-Capillary: 103 mg/dL — ABNORMAL HIGH (ref 70–99)
Glucose-Capillary: 88 mg/dL (ref 70–99)
Glucose-Capillary: 84 mg/dL (ref 70–99)
Glucose-Capillary: 90 mg/dL (ref 70–99)
Glucose-Capillary: 84 mg/dL (ref 70–99)

## 2020-03-31 IMAGING — DX DG ABD PORTABLE 1V
1 series · 1 of 1 positions shown · non-contrast
Comparison: None.

CLINICAL DATA: Gastrostomy tube placement.

EXAM:
PORTABLE ABDOMEN - 1 VIEW

[abdomen]
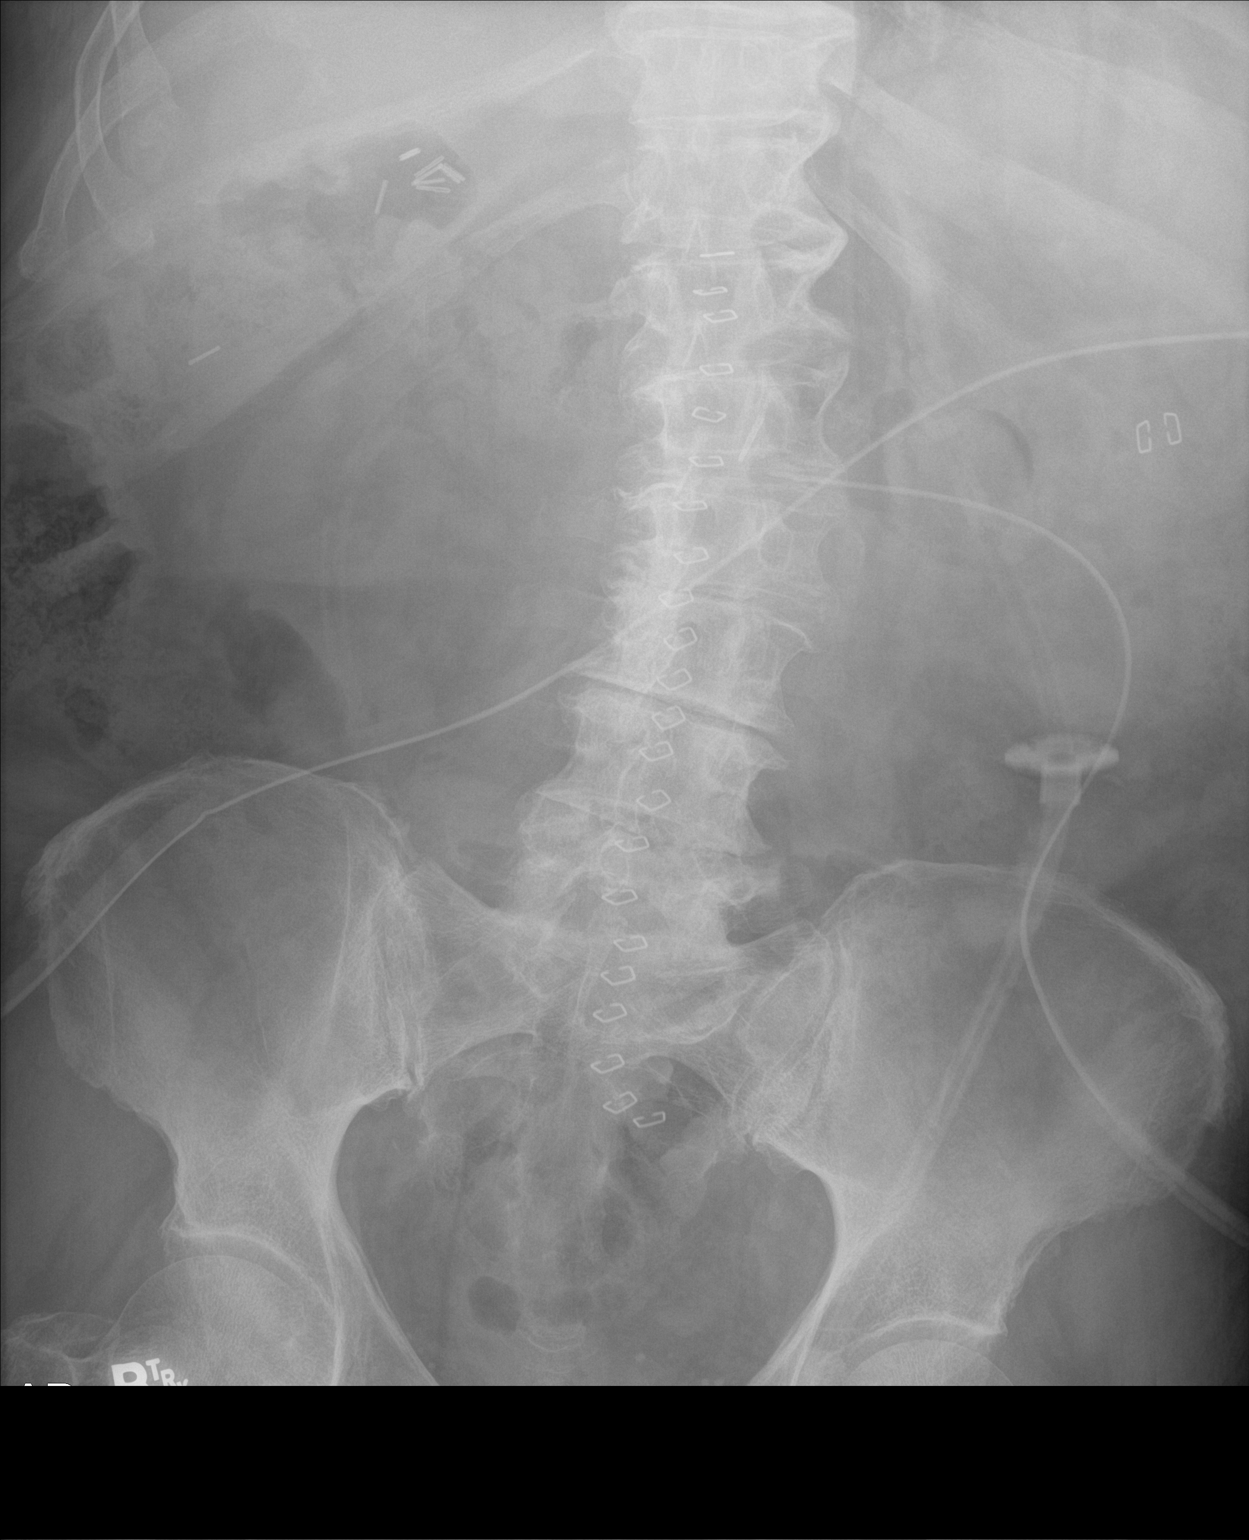

[1 of 1 positions shown; findings below may reference images not displayed]

FINDINGS: The bowel gas pattern is normal. Gastrostomy tube is seen in the
left upper quadrant in expected position of the stomach. No
radio-opaque calculi or other significant radiographic abnormality
are seen.
IMPRESSION: Gastrostomy tube tip seen in expected position of the stomach. No
evidence of bowel obstruction or ileus.

## 2020-03-31 MED ORDER — SODIUM BICARBONATE 650 MG PO TABS
650.0000 mg | ORAL_TABLET | Freq: Once | ORAL | Status: AC
  Administered 2020-03-31: 650 mg
  Filled 2020-03-31: qty 1

## 2020-03-31 MED ORDER — PANCRELIPASE (LIP-PROT-AMYL) 10440-39150 UNITS PO TABS
20880.0000 [IU] | ORAL_TABLET | Freq: Once | ORAL | Status: AC
  Administered 2020-03-31: 20880 [IU]
  Filled 2020-03-31: qty 2

## 2020-03-31 NOTE — Progress Notes (Signed)
11 Days Post-Op  Subjective: CC: Had a great day yesterday with some G tube clamping and clears, however awoke this AM with massive amount of bright yellow emesis.  He said it was clearish and did not look like tube feeds.    Objective: Vital signs in last 24 hours: Temp:  [97.6 F (36.4 C)-98.9 F (37.2 C)] 98.9 F (37.2 C) (05/10 0356) Pulse Rate:  [73-89] 89 (05/10 0356) Resp:  [15-18] 17 (05/10 0356) BP: (142-146)/(68-82) 145/82 (05/10 0356) SpO2:  [96 %-100 %] 96 % (05/10 0356) Weight:  [92 kg] 92 kg (05/10 0515) Last BM Date: 03/30/20  Intake/Output from previous day: 05/09 0701 - 05/10 0700 In: 87 [P.O.:420] Out: 8400 [Urine:1100; Emesis/NG output:200; Drains:7100] Intake/Output this shift: Total I/O In: 240 [P.O.:240] Out: 700 [Emesis/NG output:700]  PE: Gen: comfortable, no distress CV: RRR Pulm: normal rate and effort  Abd: soft, ND, NT, JP x2, both drains serosang. some irritation from staples.   Extr: wwp, no edema  Lab Results:  Recent Labs    03/31/20 0415  WBC 14.5*  HGB 10.4*  HCT 33.0*  PLT 614*   BMET Recent Labs    03/31/20 0415  NA 143  K 3.3*  CL 112*  CO2 22  GLUCOSE 129*  BUN 24*  CREATININE 0.52*  CALCIUM 8.8*   PT/INR No results for input(s): LABPROT, INR in the last 72 hours. CMP     Component Value Date/Time   NA 143 03/31/2020 0415   K 3.3 (L) 03/31/2020 0415   CL 112 (H) 03/31/2020 0415   CO2 22 03/31/2020 0415   GLUCOSE 129 (H) 03/31/2020 0415   BUN 24 (H) 03/31/2020 0415   CREATININE 0.52 (L) 03/31/2020 0415   CALCIUM 8.8 (L) 03/31/2020 0415   PROT 4.6 (L) 03/25/2020 0621   ALBUMIN 2.4 (L) 03/25/2020 0621   AST 15 03/25/2020 0621   ALT 11 03/25/2020 0621   ALKPHOS 70 03/25/2020 0621   BILITOT 0.7 03/25/2020 0621   GFRNONAA >60 03/31/2020 0415   GFRAA >60 03/31/2020 0415   Lipase     Component Value Date/Time   LIPASE 15 03/12/2020 1104       Studies/Results: DG Abd Portable 1V  Result Date:  03/31/2020 CLINICAL DATA:  Gastrostomy tube placement. EXAM: PORTABLE ABDOMEN - 1 VIEW COMPARISON:  None. FINDINGS: The bowel gas pattern is normal. Gastrostomy tube is seen in the left upper quadrant in expected position of the stomach. No radio-opaque calculi or other significant radiographic abnormality are seen. IMPRESSION: Gastrostomy tube tip seen in expected position of the stomach. No evidence of bowel obstruction or ileus. Electronically Signed   By: Marijo Conception M.D.   On: 03/31/2020 10:49    Anti-infectives: Anti-infectives (From admission, onward)   Start     Dose/Rate Route Frequency Ordered Stop   03/20/20 1900  ciprofloxacin (CIPRO) IVPB 400 mg     400 mg 200 mL/hr over 60 Minutes Intravenous Every 12 hours 03/20/20 1829 03/20/20 2136   03/20/20 0600  ciprofloxacin (CIPRO) IVPB 400 mg     400 mg 200 mL/hr over 60 Minutes Intravenous On call to O.R. 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  If OK, restart tube feeds.    NOT ready for  d/c given output from G tube and recent emesis.    FEN: TF's, CLD, IVF VTE: SCD's, Lovenox ID: Cipro periop   LOS: 11 days   Milus Height, MD FACS Surgical Oncology, General Surgery, Trauma and [place] [facility], [place] [phone] for weekday/non holidays Check [email or web address] for coverage night/weekend/holidays  Do not use SecureChat as it is not reliable for patient care.

## 2020-03-31 NOTE — Progress Notes (Signed)
Nutrition Follow-up  DOCUMENTATION CODES:   Severe malnutrition in context of chronic illness  INTERVENTION:   -Continue Boost Breeze po TID, each supplement provides 250 kcal and 9 grams of protein  Once medically able, resume: -Pivot 1.5 @60  ml/hrvia j-tube  Continue with 30 ml free water flush every 4 hours for tube patency  Provides:2340kcal, 146grams protein, and 1193ml free water.Total free water: 1364 ml daily  NUTRITION DIAGNOSIS:   Severe Malnutrition related to chronic illness(bowel obstruction) as evidenced by energy intake < or equal to 75% for > or equal to 1 month, percent weight loss.  Ongoing  GOAL:   Patient will meet greater than or equal to 90% of their needs  Progressing  MONITOR:   I & O's  REASON FOR ASSESSMENT:   Rounds    ASSESSMENT:   Pt with PMH of HTN and GERD who was dx with duodenal mass/stricture now admitted for surgery now s/p resection of 3rd, 4th portion of duodenum, gastrojejunostomy, GJ tube placement.  Reviewed I/O's: -8 L x 24 hours and -16.5 L since admission  UOP: 1.1 L x 24 hours  Emesis: 900 ml x 24 hours  Drain output: 7.1 L x 24 hours  Case discussed with RN and RNCM. Pt has experienced emesis x 2 today. TF currently on hold. He is experiencing bloating when g-tube his clamping for more than 2 hours.   Per discussion with RN, plan for x-ray to determine if tubes are in proper position; will likely resume TF if tubes are in correct position.   Pt with a G-J tube with 3 ports but unable to give any medications/prostat through the tube.Due to this, pt will require a specialized formula in order to meet his nutritional needs, as he is unable to supplement with a protein modular.   Medications reviewed and include dextrose 5%-0.9% sodium chloride infusion @ 100 ml/hr.   Labs reviewed: K: 3.3, CBGS: 84-103.  Diet Order:   Diet Order            Diet clear liquid Room service appropriate? Yes; Fluid  consistency: Thin  Diet effective now              EDUCATION NEEDS:   No education needs have been identified at this time  Skin:  Skin Assessment: Skin Integrity Issues: Skin Integrity Issues:: Incisions Incisions: closed abdomen  Last BM:  03/30/20  Height:   Ht Readings from Last 1 Encounters:  03/20/20 6' 1.5" (1.867 m)    Weight:   Wt Readings from Last 1 Encounters:  03/31/20 92 kg    Ideal Body Weight:  86.3 kg  BMI:  Body mass index is 26.4 kg/m.  Estimated Nutritional Needs:   Kcal:  2300-2500  Protein:  120-145 grams  Fluid:  >2 L/day    Loistine Chance, RD, LDN, CDCES Registered Dietitian II Certified Diabetes Care and Education Specialist Please refer to [facility] for RD and/or RD on-call/weekend/after hours pager

## 2020-03-31 NOTE — Progress Notes (Signed)
Patient just had 700 cc of emesis

## 2020-03-31 NOTE — Progress Notes (Signed)
Dr. Bobbye Morton texted paged to notify G/J tube clogged. Attempted to unclog was unsuccessful. So tube feedings on hold until the tube can be unclogged.

## 2020-03-31 NOTE — Progress Notes (Signed)
PT Cancellation Note  Patient Details Name: Albert Hartman MRN: [id number] DOB: 08/13/1946   Cancelled Treatment:    Reason Eval/Treat Not Completed: Other (comment);Medical issues which prohibited therapy.  Per his wife he is not well, despite taking two very long walks yesterday.  Has been vomiting and now is resting, will retry as time and pt allow.   Ramond Dial 03/31/2020, 12:35 PM   Mee Hives, PT MS Acute Rehab Dept. Number: [place]

## 2020-03-31 NOTE — Plan of Care (Signed)
  Problem: Education: Goal: Knowledge of General Education information will improve Description Including pain rating scale, medication(s)/side effects and non-pharmacologic comfort measures Outcome: Progressing   

## 2020-03-31 NOTE — Progress Notes (Signed)
Paged on call surgery MD Annye English to inform that pt has suddenly experienced several episodes of vomiting and drainage has also decreased when previously there was substantial output. Tube feed has been stopped. 03/31/2020 0615 Cyndi Bender 03/31/2009 @ 0620 Dr. Dema Severin returned call with orders to flush port with NS and stated MD will be in to assess pt this am. 03/31/2020 0622 Trinna Post

## 2020-03-31 NOTE — Progress Notes (Signed)
Patient has home CPAP and places himself on/off when ready. RT will monitor as needed.

## 2020-03-31 NOTE — Progress Notes (Signed)
OT Cancellation Note  Patient Details Name: Albert Hartman MRN: [id number] DOB: [birth year]   Cancelled Treatment:    Reason Eval/Treat Not Completed: Fatigue/lethargy limiting ability to participate. Pt asleep in bed, family reports "rough night" and emesis recently per RN.  Will follow and see as able.   Jolaine Artist, OT Acute Rehabilitation Services Pager [phone] Office [phone]    [name] 03/31/2020, 11:39 AM

## 2020-04-01 ENCOUNTER — Inpatient Hospital Stay (HOSPITAL_COMMUNITY): Payer: Medicare Other

## 2020-04-01 HISTORY — PX: IR GJ TUBE CHANGE: IMG1440

## 2020-04-01 LAB — BASIC METABOLIC PANEL
Anion gap: 9 (ref 5–15)
BUN: 19 mg/dL (ref 8–23)
CO2: 24 mmol/L (ref 22–32)
Calcium: 8.7 mg/dL — ABNORMAL LOW (ref 8.9–10.3)
Chloride: 111 mmol/L (ref 98–111)
Creatinine, Ser: 0.47 mg/dL — ABNORMAL LOW (ref 0.61–1.24)
GFR calc Af Amer: >60 mL/min (ref >60)
GFR calc non Af Amer: >60 mL/min (ref >60)
Glucose, Bld: 125 mg/dL — ABNORMAL HIGH (ref 70–99)
Potassium: 3.1 mmol/L — ABNORMAL LOW (ref 3.5–5.1)
Sodium: 144 mmol/L (ref 135–145)

## 2020-04-01 LAB — CBC
HCT: 31.3 % — ABNORMAL LOW (ref 39.0–52.0)
Hemoglobin: 9.8 g/dL — ABNORMAL LOW (ref 13.0–17.0)
MCH: 29.3 pg (ref 26.0–34.0)
MCHC: 31.3 g/dL (ref 30.0–36.0)
MCV: 93.7 fL (ref 80.0–100.0)
Platelets: 621 10*3/uL — ABNORMAL HIGH (ref 150–400)
RBC: 3.34 MIL/uL — ABNORMAL LOW (ref 4.22–5.81)
RDW: 15.9 % — ABNORMAL HIGH (ref 11.5–15.5)
WBC: 14.7 10*3/uL — ABNORMAL HIGH (ref 4.0–10.5)
nRBC: 0 % (ref 0.0–0.2)

## 2020-04-01 LAB — GLUCOSE, CAPILLARY
Glucose-Capillary: 104 mg/dL — ABNORMAL HIGH (ref 70–99)
Glucose-Capillary: 82 mg/dL (ref 70–99)
Glucose-Capillary: 96 mg/dL (ref 70–99)

## 2020-04-01 IMAGING — XA IR REPLACE G/J TUBE W/ FLUORO
6 of 7 series · 13 of 22 positions shown · non-contrast
Comparison: none

INDICATION: 73-year-old male with a history of surgically placed
gastrojejunostomy, pyloroplasty, resection third and fourth portion
of the duodenum twelve days ago with nonfunctional GJ tube.

[Series 1: fl (-) angio · 2 of 17 frames shown (1 of 6)]
[frame 3/17]
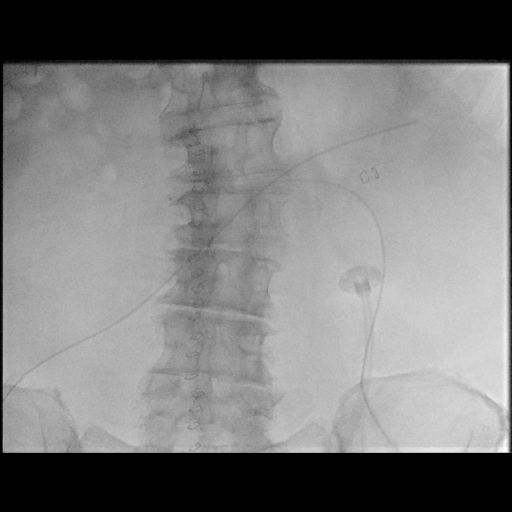
[frame 15/17]
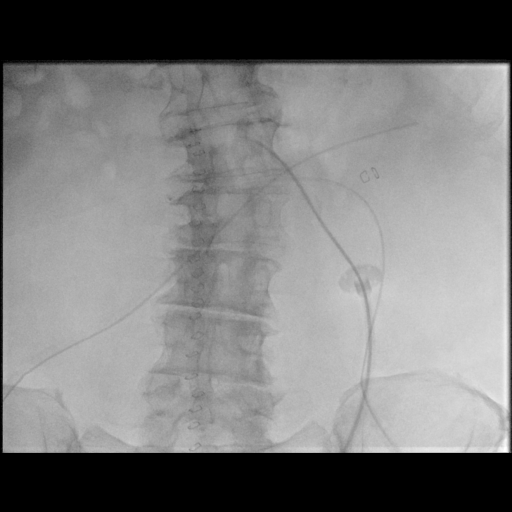

[Series 3: fl (-) angio · 1 of 1 slices shown (2 of 6)]
[im 1/1]
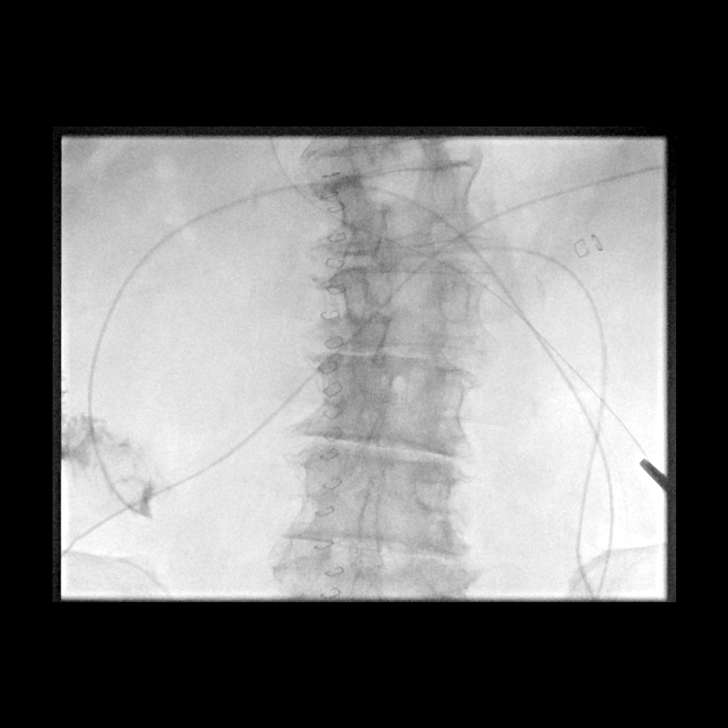

[Series 4: fl (-) angio · 1 of 1 slices shown (3 of 6)]
[im 1/1]
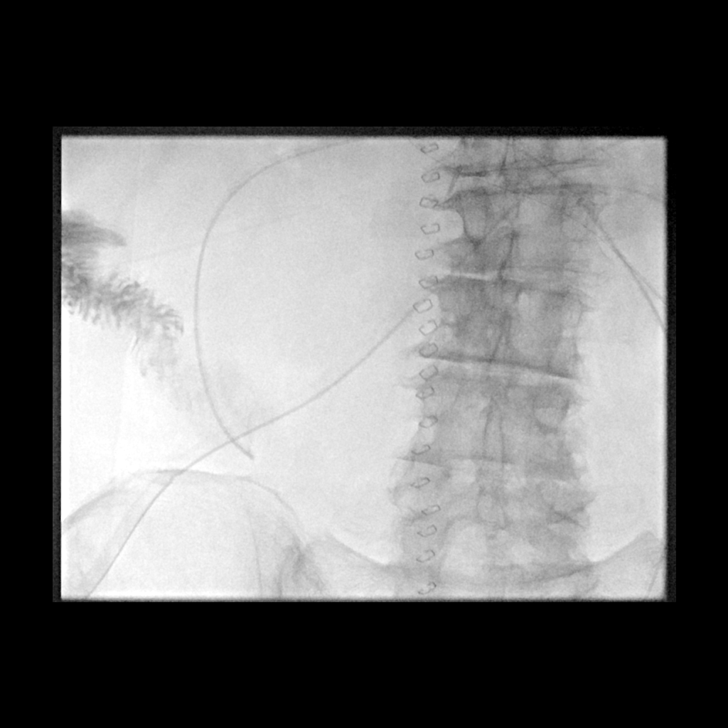

[Series 5: fl (-) angio · 3 acquisitions, 3 frames shown (4 of 6)]
[im 1/3]
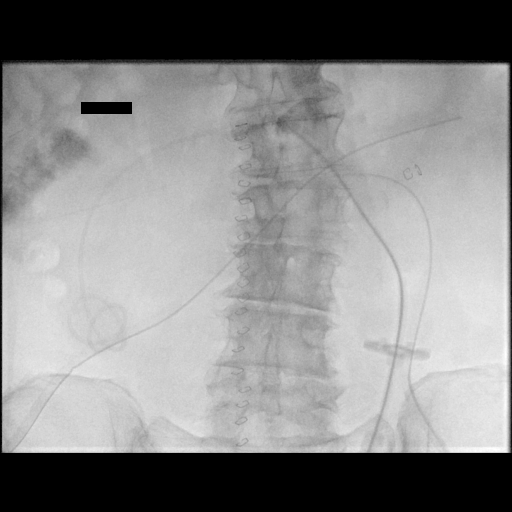
[im 1/3]
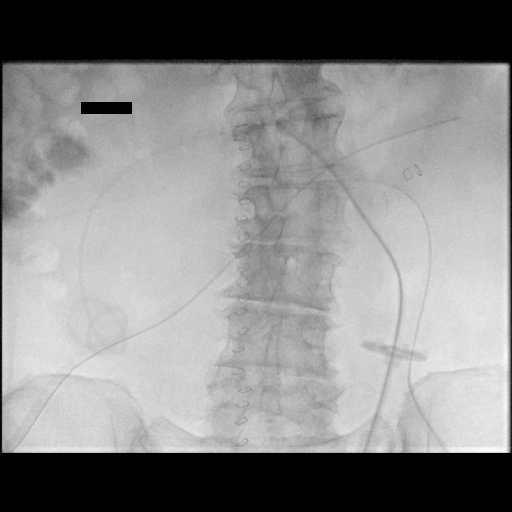
[im 3/3]
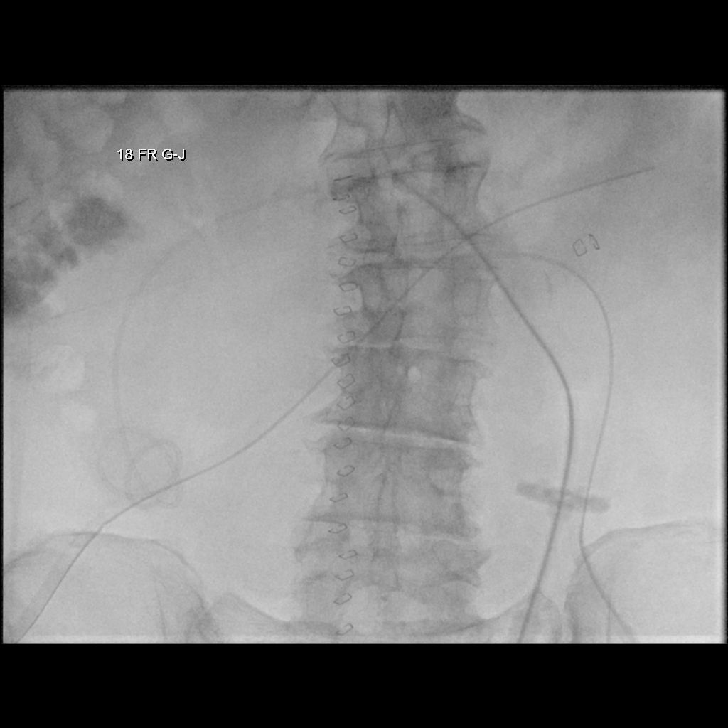

[Series 6: fl (-) angio · 2 acquisitions, 3 frames shown (5 of 6)]
[im 1/2]
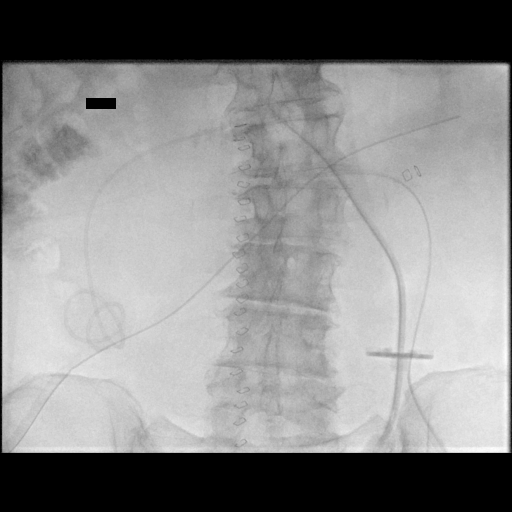
[im 1/2]
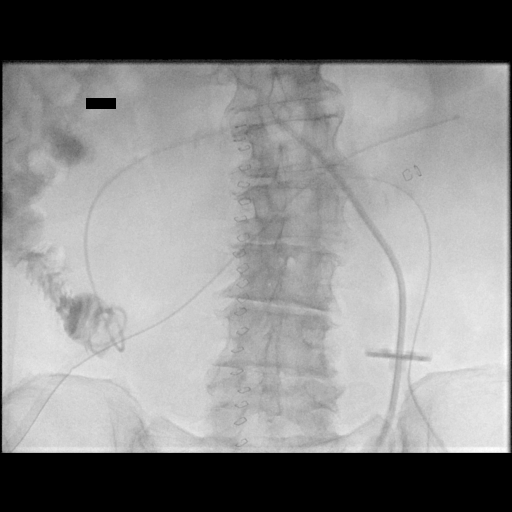
[im 2/2]
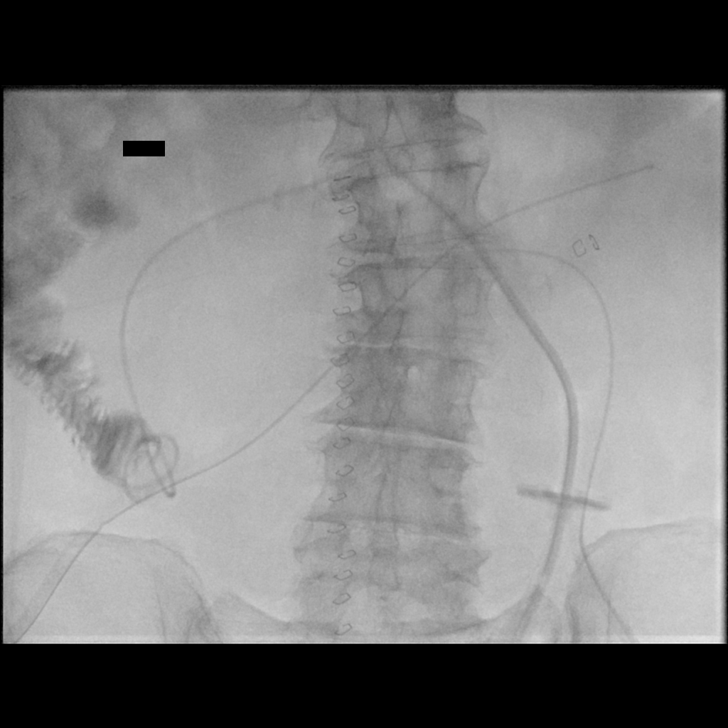

[Series 7: fl (-) angio · 2 acquisitions, 3 frames shown (6 of 6)]
[im 1/2]
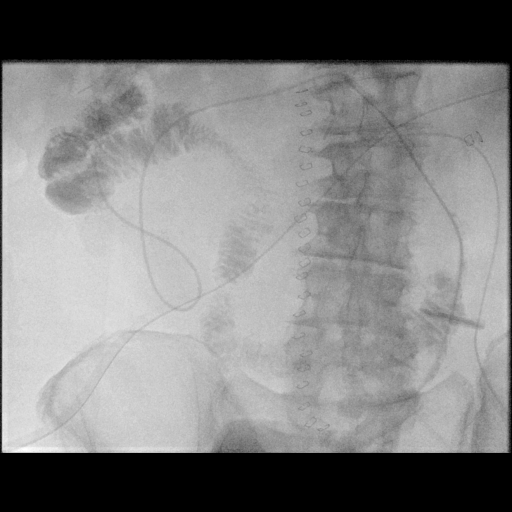
[im 1/2]
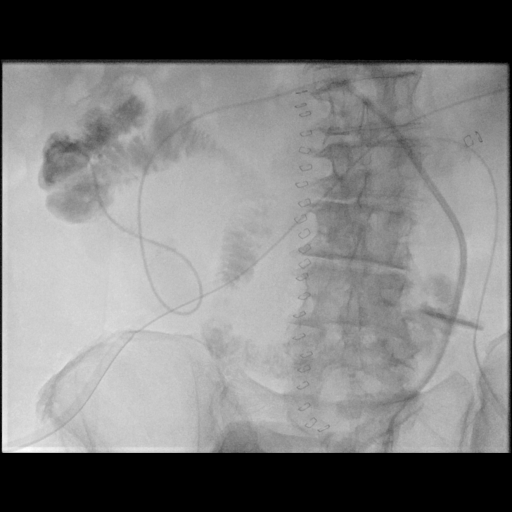
[im 2/2]
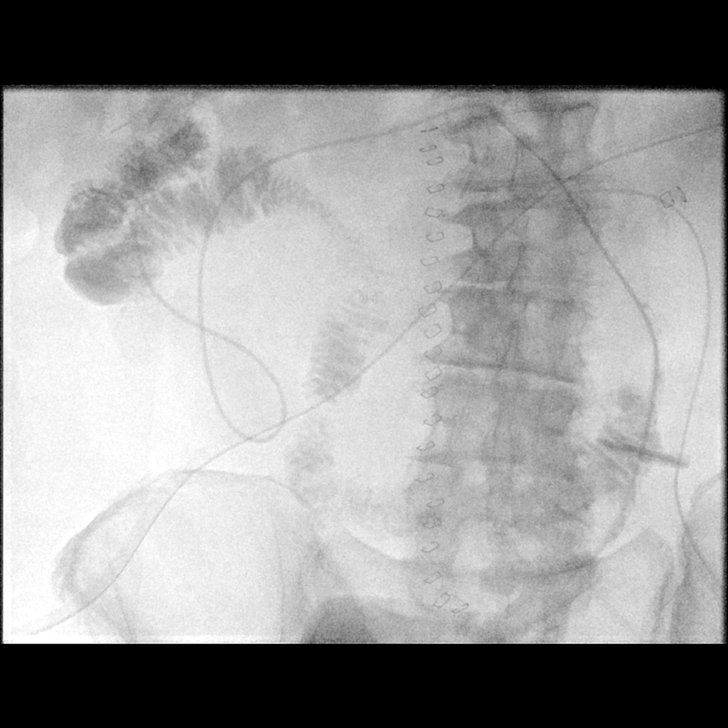

[13 of 22 positions shown; findings below may reference images not displayed]

EXAM:
IMAGE GUIDED REPLACEMENT OF GASTROJEJUNOSTOMY

MEDICATIONS:
None

ANESTHESIA/SEDATION:
None

CONTRAST:  25mL OMNIPAQUE IOHEXOL 300 MG/ML SOLN - administered into
the gastric lumen.

FLUOROSCOPY TIME:  Fluoroscopy Time: 4 minutes 24 seconds (15 mGy).

COMPLICATIONS:
None

PROCEDURE:
Informed written consent was obtained from the patient and the
patient's family after a thorough discussion of the procedural
risks, benefits and alternatives. All questions were addressed.
Maximal Sterile Barrier Technique was utilized including caps, mask,
sterile gowns, sterile gloves, sterile drape, hand hygiene and skin
antiseptic. A timeout was performed prior to the initiation of the
procedure.

The epigastrium was prepped with Betadine in a sterile fashion, and
a sterile drape was applied covering the operative field. A sterile
gown and sterile gloves were used for the procedure.

The surgically placed GJ tube was interrogated with attempted
infusion of contrast through the occluded jejunostomy port. This
resulted in damage to the wall of the proximal catheter at the hub.
The lumen for the stomach was not occluded and contrast free flow
NEOFITOS into the stomach lumen.

We then placed a stiff Glidewire through the lumen of the
jejunostomy, and the wire exited the side hole in the proximal
jejunostomy limb. This was then passed parallel to the jejunostomy
limb within the duodenum, achieving a position within the lumen of
the transition [DATE] portion of the duodenum.

The retention sutures at the skin were then ligated and removed. The
balloon was completely deflated.

The surgical and nonfunctional GJ tube was then removed from the
Glidewire with the Glidewire maintained within the proximal small
bowel.

A new 18 French 58 cm gastrojejunostomy tube was modified with
amputation of the distal 15 cm. Catheter was placed over the
Glidewire into the proximal small bowel. The back end of a standard
Glidewire was used to unfurl the redundant jejunal limb.

7 cc of saline used to inflate the balloon.

Both the stomach lumen and the jejunal lumen were confirmed to be
patent and within the appropriate location with infusion of small
amount of contrast. Both limbs were flushed.

The bumper was then advanced to the skin surface.

Patient tolerated the procedure well and remained hemodynamically
stable throughout.

No complications were encountered and no significant blood loss.

The stomach was distended with gas. Under fluoroscopic guidance, an
18 gauge needle was utilized to puncture the anterior wall of the
body of the stomach. An Amplatz wire was advanced through the needle
passing a T fastener into the lumen of the stomach. The T fastener
was secured for gastropexy. A 9-French sheath was inserted.

A snare was advanced through the 9-French sheath. NEOFITOS was
advanced through the orogastric tube. It was snared then pulled out
the oral cavity, pulling the snare, as well. The leading edge of the
gastrostomy was attached to the snare. It was then pulled down the
esophagus and out the percutaneous site. Tube secured in place.
Contrast was injected.

Patient tolerated the procedure well and remained hemodynamically
stable throughout.

No complications were encountered and no significant blood loss
encountered.
FINDINGS: The new 58 cm 18 French GJ tube terminates within the proximal small
bowel. The final 15 cm were amputated. If a future exchanges
required with a similar GJ tube, would amputate the distal 20-25 cm.
IMPRESSION: Status post exchange of a nonfunctional surgically placed
gastrojejunostomy tube with placement of a new 18 French GJ,
modified as above.

## 2020-04-01 MED ORDER — IOHEXOL 300 MG/ML  SOLN
50.0000 mL | Freq: Once | INTRAMUSCULAR | Status: AC | PRN
  Administered 2020-04-01: 16:00:00 25 mL

## 2020-04-01 NOTE — Progress Notes (Signed)
Patient has home CPAP and is able to place himself on/off when ready. RT will monitor as needed.

## 2020-04-01 NOTE — Progress Notes (Signed)
12 Days Post-Op  Subjective: CC: J tube clogged.  Attempts were made to unclog.  However, there is a bulge in the side of the tube when attempts are made to flush  Objective: Vital signs in last 24 hours: Temp:  [98.2 F (36.8 C)-98.4 F (36.9 C)] 98.2 F (36.8 C) (05/11 0559) Pulse Rate:  [70-87] 70 (05/11 0559) Resp:  [15-16] 15 (05/11 0559) BP: (135-137)/(68-80) 137/80 (05/11 0559) SpO2:  [93 %-99 %] 99 % (05/11 0559) Weight:  [91 kg] 91 kg (05/11 0442) Last BM Date: 03/30/20  Intake/Output from previous day: 05/10 0701 - 05/11 0700 In: 2144.5 [P.O.:240; I.V.:1904.5] Out: 2995 [Urine:320; Emesis/NG output:700; Drains:1975] Intake/Output this shift: No intake/output data recorded.  PE: Gen: comfortable, no distress Pulm: normal rate and effort  Abd: soft, ND, NT, JP x2, both drains serosang. some irritation from staples.   Extr: wwp, no edema   Lab Results:  Recent Labs    03/31/20 0415 04/01/20 0350  WBC 14.5* 14.7*  HGB 10.4* 9.8*  HCT 33.0* 31.3*  PLT 614* 621*   BMET Recent Labs    03/31/20 0415 04/01/20 0350  NA 143 144  K 3.3* 3.1*  CL 112* 111  CO2 22 24  GLUCOSE 129* 125*  BUN 24* 19  CREATININE 0.52* 0.47*  CALCIUM 8.8* 8.7*   PT/INR No results for input(s): LABPROT, INR in the last 72 hours. CMP     Component Value Date/Time   NA 144 04/01/2020 0350   K 3.1 (L) 04/01/2020 0350   CL 111 04/01/2020 0350   CO2 24 04/01/2020 0350   GLUCOSE 125 (H) 04/01/2020 0350   BUN 19 04/01/2020 0350   CREATININE 0.47 (L) 04/01/2020 0350   CALCIUM 8.7 (L) 04/01/2020 0350   PROT 4.6 (L) 03/25/2020 0621   ALBUMIN 2.4 (L) 03/25/2020 0621   AST 15 03/25/2020 0621   ALT 11 03/25/2020 0621   ALKPHOS 70 03/25/2020 0621   BILITOT 0.7 03/25/2020 0621   GFRNONAA >60 04/01/2020 0350   GFRAA >60 04/01/2020 0350   Lipase     Component Value Date/Time   LIPASE 15 03/12/2020 1104       Studies/Results: DG Abd Portable 1V  Result Date:  03/31/2020 CLINICAL DATA:  Gastrostomy tube placement. EXAM: PORTABLE ABDOMEN - 1 VIEW COMPARISON:  None. FINDINGS: The bowel gas pattern is normal. Gastrostomy tube is seen in the left upper quadrant in expected position of the stomach. No radio-opaque calculi or other significant radiographic abnormality are seen. IMPRESSION: Gastrostomy tube tip seen in expected position of the stomach. No evidence of bowel obstruction or ileus. Electronically Signed   By: Marijo Conception M.D.   On: 03/31/2020 10:49    Anti-infectives: Anti-infectives (From admission, onward)   Start     Dose/Rate Route Frequency Ordered Stop   03/20/20 1900  ciprofloxacin (CIPRO) IVPB 400 mg     400 mg 200 mL/hr over 60 Minutes Intravenous Every 12 hours 03/20/20 1829 03/20/20 2136   03/20/20 0600  ciprofloxacin (CIPRO) IVPB 400 mg     400 mg 200 mL/hr over 60 Minutes Intravenous On call to O.R. 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    FEN: TF's, CLD, IVF  VTE: SCD's, Lovenox ID: Cipro periop   LOS: 12 days   Milus Height, MD FACS Surgical Oncology, General Surgery, Trauma and [facility], [place] for weekday/non holidays Check [email or web address] for coverage night/weekend/holidays  Do not use SecureChat as it is not reliable for patient care.

## 2020-04-01 NOTE — Progress Notes (Addendum)
Nutrition Follow-up  DOCUMENTATION CODES:   Severe malnutrition in context of chronic illness  INTERVENTION:   -Continue Boost Breeze po TID, each supplement provides 250 kcal and 9 grams of protein  Once able, resume:  -Pivot 1.5 @60  ml/hrvia j-tube  Continue with 30 ml free water flush every 4 hours for tube patency  Provides:2340kcal, 146grams protein, and 1115ml free water.Total free water: 1364 ml daily  NUTRITION DIAGNOSIS:   Severe Malnutrition related to chronic illness(bowel obstruction) as evidenced by energy intake < or equal to 75% for > or equal to 1 month, percent weight loss.  Ongoing  GOAL:   Patient will meet greater than or equal to 90% of their needs  Progressing   MONITOR:   I & O's  REASON FOR ASSESSMENT:   Rounds    ASSESSMENT:   Pt with PMH of HTN and GERD who was dx with duodenal mass/stricture now admitted for surgery now s/p resection of 3rd, 4th portion of duodenum, gastrojejunostomy, GJ tube placement.  Reviewed I/O's: +851 ml x 24 hours and -17.4 L since admission  UOP: 320 ml x 24 hours  Emesis: 700 ml x 24 hours  Drain output: 2 L x 24 hours  TF to be resumed yesterday, however, G/J tube is now clogged, so TF held until tube can be unclogged. Per RN, plan to go to IR to replace tube.  Pt with a G-J tube with 3 ports but unable to give any medications/prostat through the tube.Due to this, pt will require a specialized formula in order to meet his nutritional needs, as he is unable to supplement with a protein modular.  ADDENDUM: Case discussed with Carolynn Sayers of [place] regarding TF regimen for home. Pt is currently on a clear liquid diet and receiving Boost Breeze supplements. Intake on clear liquids has been poor due to nausea and vomiting and pt has refused all Boost Breeze supplements over the past 48 hours. Due to limitations of clear liquid diet, pt would still require majority of his nutrition to be  supplemented by TF (even if pt consumed all of 3 Boost Breeze supplements consistently, this would only meet approximately 30% of his estimated nutritional needs). Current j-tube is not compatible with medications and protein modular (prostat), so specialized formula of Pivot 1.5 is being used to ensure pt meets nutritional needs. J-tube to be replaced today, so will follow-up with ability to transition to a less specialized formula if new tube is compatible to deliver medications and protein modular. If new tube is still unable to administer protein modular and medications and diet is not advanced and PO intake does not improve, pt will continue to require specialized formula (Pivot 1.5) to meet nutritional needs.   Labs reviewed.   Diet Order:   Diet Order            Diet clear liquid Room service appropriate? Yes; Fluid consistency: Thin  Diet effective now              EDUCATION NEEDS:   No education needs have been identified at this time  Skin:  Skin Assessment: Skin Integrity Issues: Skin Integrity Issues:: Incisions Incisions: closed abdomen  Last BM:  04/01/20  Height:   Ht Readings from Last 1 Encounters:  03/20/20 6' 1.5" (1.867 m)    Weight:   Wt Readings from Last 1 Encounters:  04/01/20 91 kg    Ideal Body Weight:  86.3 kg  BMI:  Body mass index is 26.11 kg/m.  Estimated Nutritional Needs:   Kcal:  2300-2500  Protein:  120-145 grams  Fluid:  >2 L/day    Loistine Chance, RD, LDN, CDCES Registered Dietitian II Certified Diabetes Care and Education Specialist Please refer to [facility] for RD and/or RD on-call/weekend/after hours pager

## 2020-04-01 NOTE — Progress Notes (Signed)
PT Cancellation Note  Patient Details Name: Albert Hartman MRN: [id number] DOB: 11-30-1945   Cancelled Treatment:    Reason Eval/Treat Not Completed: Patient at procedure or test/unavailable. Patient waiting for transport for procedure. Will check back as schedule permits.   Rolland Porter SPT 04/01/2020    Rolland Porter 04/01/2020, 1:38 PM

## 2020-04-01 NOTE — Consult Note (Signed)
Chief Complaint: Gastric outlet obstruction. J portion clogged.  Referring Physician(s): Dr. Barry Dienes  Supervising Physician: Corrie Mckusick  Patient Status: [facility] - In-pt  History of Present Illness: Albert Hartman is a 74 y.o. male History of gastic outlet obstuction s/p laparoscopic resection of the 3rd and 4th portion of the duodenum with pyloplasty and GJ tube on 4.29.21. !8 Fr. J portion is clogged and attempts to ubnclog were unsuccessful at bedside. Team is requesting GJ tube exchange. Of note patient still has surgical drains in place The GJ tube is Stamm placed with 2 pursestring sutures.   Past Medical History:  Diagnosis Date  . GERD (gastroesophageal reflux disease)   . History of kidney stones   . Hypertension   . Sleep apnea     Past Surgical History:  Procedure Laterality Date  . APPENDECTOMY    . BIOPSY  01/24/2020   Procedure: BIOPSY;  Surgeon: Wonda Horner, MD;  Location: [facility];  Service: Endoscopy;;  . BOWEL RESECTION N/A 03/20/2020   Procedure: Resection of Third and Fourth Portions of Duodenum;  Surgeon: Stark Klein, MD;  Location: [place];  Service: General;  Laterality: N/A;  . CHOLECYSTECTOMY    . ENTEROSCOPY N/A 01/24/2020   Procedure: ENTEROSCOPY WITH LONG ENTEROSCOPE;  Surgeon: Wonda Horner, MD;  Location: [facility];  Service: Endoscopy;  Laterality: N/A;  . EYE SURGERY    . GASTROJEJUNOSTOMY N/A 03/20/2020   Procedure: Gastrojejunostomy;  Surgeon: Stark Klein, MD;  Location: [place];  Service: General;  Laterality: N/A;  . GASTROSTOMY N/A 03/20/2020   Procedure: Insertion Of Gastrojejunostomy Tube;  Surgeon: Stark Klein, MD;  Location: [place];  Service: General;  Laterality: N/A;  . LAPAROSCOPY N/A 03/20/2020   Procedure: LAPAROSCOPY DIAGNOSTIC;  Surgeon: Stark Klein, MD;  Location: [place];  Service: General;  Laterality: N/A;  . PYLOROPLASTY N/A 03/20/2020   Procedure: Pyloroplasty;  Surgeon: Stark Klein, MD;  Location: [place];  Service:  General;  Laterality: N/A;  . SUBMUCOSAL TATTOO INJECTION  01/24/2020   Procedure: SUBMUCOSAL TATTOO INJECTION;  Surgeon: Wonda Horner, MD;  Location: [facility];  Service: Endoscopy;;  . TONSILLECTOMY      Allergies: Penicillins and Sulfa antibiotics  Medications: Prior to Admission medications   Medication Sig Start Date End Date Taking? Authorizing Provider  allopurinol (ZYLOPRIM) 300 MG tablet Take 300 mg by mouth daily. 12/06/19  Yes [provider]  fluocinonide (LIDEX) 0.05 % external solution Apply 1 application topically 2 (two) times a week. Applied to scalp twice weekly after washing hair. 11/07/19  Yes [provider]  hydrocortisone 2.5 % cream Apply 1 application topically 2 (two) times a week. Sundays & Saturdays. 01/15/20  Yes [provider]  ketoconazole (NIZORAL) 2 % cream Apply 1 application topically every Monday, Tuesday, Wednesday, Thursday, and Friday. Applied to face 11/19/19  Yes [provider]  lisinopril (ZESTRIL) 10 MG tablet Take 10 mg by mouth at bedtime. 01/30/20  Yes [provider]  Multiple Vitamin (MULTIVITAMIN WITH MINERALS) TABS tablet Take 1 tablet by mouth daily. Centrum Silver for Men 50+   Yes [provider]  nystatin cream (MYCOSTATIN) Apply 1 application topically 2 (two) times daily. Applied to groin rash 02/26/20  Yes [provider]  omeprazole (PRILOSEC) 40 MG capsule Take 40 mg by mouth in the morning and at bedtime. In the morning & 1630 11/19/19  Yes [provider]  Polyethyl Glycol-Propyl Glycol (LUBRICANT EYE DROPS) 0.4-0.3 % SOLN Place 1 drop  into both eyes in the morning and at bedtime.   Yes [provider]     History reviewed. No pertinent family history.  Social History   Socioeconomic History  . Marital status: Married    Spouse name: Not on file  . Number of children: Not on file  . Years of education: Not on file  . Highest education level: Not  on file  Occupational History  . Not on file  Tobacco Use  . Smoking status: Never Smoker  . Smokeless tobacco: Never Used  Substance and Sexual Activity  . Alcohol use: Not Currently  . Drug use: Never  . Sexual activity: Not on file  Other Topics Concern  . Not on file  Social History Narrative  . Not on file   Social Determinants of Health   Financial Resource Strain:   . Difficulty of Paying Living Expenses:   Food Insecurity:   . Worried About Charity fundraiser in the Last Year:   . Arboriculturist in the Last Year:   Transportation Needs:   . Film/video editor (Medical):   Marland Kitchen Lack of Transportation (Non-Medical):   Physical Activity:   . Days of Exercise per Week:   . Minutes of Exercise per Session:   Stress:   . Feeling of Stress :   Social Connections:   . Frequency of Communication with Friends and Family:   . Frequency of Social Gatherings with Friends and Family:   . Attends Religious Services:   . Active Member of Clubs or Organizations:   . Attends Archivist Meetings:   Marland Kitchen Marital Status:     Review of Systems: A 12 point ROS discussed and pertinent positives are indicated in the HPI above.  All other systems are negative.  Review of Systems  Constitutional: Negative for fever.  HENT: Negative for congestion.   Respiratory: Negative for cough and shortness of breath.   Cardiovascular: Negative for chest pain.  Gastrointestinal: Negative for abdominal pain.  Neurological: Negative for headaches.  Psychiatric/Behavioral: Negative for behavioral problems and confusion.    Vital Signs: BP 139/75 (BP Location: Right Arm)   Pulse 80   Temp 98.1 F (36.7 C) (Oral)   Resp 17   Ht 6' 1.5" (1.867 m)   Wt 200 lb 9.9 oz (91 kg)   SpO2 98%   BMI 26.11 kg/m   Physical Exam Vitals and nursing note reviewed.  Constitutional:      Appearance: He is well-developed.  HENT:     Head: Normocephalic.  Cardiovascular:     Rate and Rhythm:  Normal rate and regular rhythm.     Heart sounds: Normal heart sounds.  Pulmonary:     Effort: Pulmonary effort is normal.     Breath sounds: Normal breath sounds.  Musculoskeletal:        General: Normal range of motion.     Cervical back: Normal range of motion.  Skin:    General: Skin is dry.  Neurological:     Mental Status: He is alert and oriented to person, place, and time.     Imaging: DG CHEST PORT 1 VIEW  Result Date: 03/26/2020 CLINICAL DATA:  Fever EXAM: PORTABLE CHEST 1 VIEW COMPARISON:  None. FINDINGS: 0923 hours. Low volumes. Cardiopericardial silhouette is at upper limits of normal for size. Vascular congestion noted with diffuse interstitial opacities suggesting edema. There is bibasilar atelectasis or infiltrate. No substantial pleural effusion. Bones are diffusely demineralized. IMPRESSION: 1.  Vascular congestion with probable interstitial edema. 2. Bibasilar atelectasis or infiltrate. Electronically Signed   By: Misty Stanley M.D.   On: 03/26/2020 10:38   DG Abd Portable 1V  Result Date: 03/31/2020 CLINICAL DATA:  Gastrostomy tube placement. EXAM: PORTABLE ABDOMEN - 1 VIEW COMPARISON:  None. FINDINGS: The bowel gas pattern is normal. Gastrostomy tube is seen in the left upper quadrant in expected position of the stomach. No radio-opaque calculi or other significant radiographic abnormality are seen. IMPRESSION: Gastrostomy tube tip seen in expected position of the stomach. No evidence of bowel obstruction or ileus. Electronically Signed   By: Marijo Conception M.D.   On: 03/31/2020 10:49    Labs:  CBC: Recent Labs    03/25/20 0621 03/28/20 0251 03/31/20 0415 04/01/20 0350  WBC 5.4 4.9 14.5* 14.7*  HGB 9.9* 10.4* 10.4* 9.8*  HCT 29.4* 32.1* 33.0* 31.3*  PLT 333 420* 614* 621*    COAGS: Recent Labs    03/12/20 1104 03/21/20 0402 03/25/20 1018  INR 1.0 1.2 1.2    BMP: Recent Labs    03/25/20 0621 03/28/20 0251 03/31/20 0415 04/01/20 0350  NA 135  137 143 144  K 3.3* 3.2* 3.3* 3.1*  CL 100 103 112* 111  CO2 26 24 22 24   GLUCOSE 112* 112* 129* 125*  BUN 6* 18 24* 19  CALCIUM 8.3* 8.4* 8.8* 8.7*  CREATININE 0.50* 0.55* 0.52* 0.47*  GFRNONAA >60 >60 >60 >60  GFRAA >60 >60 >60 >60    LIVER FUNCTION TESTS: Recent Labs    03/22/20 0458 03/23/20 0233 03/24/20 0613 03/25/20 0621  BILITOT 0.5 1.1 1.0 0.7  AST 16 14* 18 15  ALT 12 11 10 11   ALKPHOS 58 52 58 70  PROT 4.8* 5.4* 5.0* 4.6*  ALBUMIN 2.7* 3.4* 2.7* 2.4*    TUMOR MARKERS: No results for input(s): AFPTM, CEA, CA199, CHROMGRNA in the last 8760 hours.  Assessment and Plan:  74 y.o, male inpatient. History of gastic outlet obstuction s/p laparoscopic resection of the 3rd and 4th portion of the duodenum with pyloplasty and GJ tube on 4.29.21. !8 Fr. J portion is clogged and attempts to ubnclog were unsuccessful at bedside. Team is requesting GJ tube exchange. Of note patient still has surgical drains in place The GJ tube is Stamm placed with 2 pursestring sutures.  Pertinent Imaging None relevent  Pertinent IR History none  Pertinent Allergies PCN Sulfa  WBC is 14.7, Cr 0.47  All labs are within acceptable parameters. Patient is on subcutaneous prophylactic dose of lovenox.    Risks and benefits image guided gastrostomy jejunostomy tube replacement was discussed with the patient including, but not limited to the need for a barium enema during the procedure, bleeding, infection, peritonitis and/or damage to adjacent structures.  All of the patient's questions were answered, patient is agreeable to proceed.  Consent signed and in chart.     Thank you for this interesting consult.  I greatly enjoyed meeting Corbett Spittle and look forward to participating in their care.  A copy of this report was sent to the requesting provider on this date.  Electronically Signed: Avel Peace, NP 04/01/2020, 12:07 PM   I spent a total of 40 Minutes    in face  to face in clinical consultation, greater than 50% of which was counseling/coordinating care for GJ tube exchange

## 2020-04-01 NOTE — Procedures (Signed)
Interventional Radiology Procedure Note  Procedure: Exchange of surgical GJ, for new 58cm, 57F balloon retention GJ tube.  The tube was modified with the final 15cm amputated.   Findings: The existing GJ was damaged, and clogged.  The new GJ terminates in the proximal jejunum.  7cc used to inflate the balloon  Complications: None  Recommendations:  - OK to use - Do not submerge - Routine care   Signed,  Dulcy Fanny. Earleen Newport, DO

## 2020-04-02 LAB — SURGICAL PATHOLOGY

## 2020-04-02 LAB — GLUCOSE, CAPILLARY
Glucose-Capillary: 103 mg/dL — ABNORMAL HIGH (ref 70–99)
Glucose-Capillary: 104 mg/dL — ABNORMAL HIGH (ref 70–99)
Glucose-Capillary: 104 mg/dL — ABNORMAL HIGH (ref 70–99)
Glucose-Capillary: 108 mg/dL — ABNORMAL HIGH (ref 70–99)
Glucose-Capillary: 113 mg/dL — ABNORMAL HIGH (ref 70–99)
Glucose-Capillary: 116 mg/dL — ABNORMAL HIGH (ref 70–99)
Glucose-Capillary: 121 mg/dL — ABNORMAL HIGH (ref 70–99)

## 2020-04-02 MED ORDER — OSMOLITE 1.5 CAL PO LIQD
1500.0000 mL | ORAL | Status: DC
  Administered 2020-04-03 – 2020-04-05 (×5): 1500 mL
  Administered 2020-04-06: 1000 mL
  Administered 2020-04-06: 1500 mL
  Administered 2020-04-07: 1000 mL
  Filled 2020-04-02 (×2): qty 2000
  Filled 2020-04-02: qty 1000
  Filled 2020-04-02 (×4): qty 2000

## 2020-04-02 MED ORDER — PIVOT 1.5 CAL PO LIQD
1500.0000 mL | ORAL | Status: DC
  Administered 2020-04-02 – 2020-04-03 (×2): 1500 mL
  Filled 2020-04-02: qty 2000

## 2020-04-02 NOTE — Progress Notes (Signed)
13 Days Post-Op  Subjective: CC: GJ tube change successful.  Gets very full when G tube isn't draining.  Has to stand up in order to get the stomach to empty.  Objective: Vital signs in last 24 hours: Temp:  [97.9 F (36.6 C)-98.5 F (36.9 C)] 98.1 F (36.7 C) (05/12 0400) Pulse Rate:  [79-84] 81 (05/12 0400) Resp:  [16-20] 18 (05/12 0400) BP: (140-149)/(69-77) 140/77 (05/12 0400) SpO2:  [96 %-100 %] 100 % (05/12 0400) Weight:  [90.1 kg] 90.1 kg (05/12 0426) Last BM Date: 03/30/20  Intake/Output from previous day: 05/11 0701 - 05/12 0700 In: 3290.8 [P.O.:240; I.V.:2479.8; NG/GT:541] Out: 4415 [Urine:550; Drains:3865] Intake/Output this shift: No intake/output data recorded.  PE: Gen: comfortable, no distress Pulm: normal rate and effort  Abd: soft, ND, NT, JP x2, left drain sl murky, but minimal output.  Right drain serosang.   Extr: wwp, no edema   Lab Results:  Recent Labs    03/31/20 0415 04/01/20 0350  WBC 14.5* 14.7*  HGB 10.4* 9.8*  HCT 33.0* 31.3*  PLT 614* 621*   BMET Recent Labs    03/31/20 0415 04/01/20 0350  NA 143 144  K 3.3* 3.1*  CL 112* 111  CO2 22 24  GLUCOSE 129* 125*  BUN 24* 19  CREATININE 0.52* 0.47*  CALCIUM 8.8* 8.7*   PT/INR No results for input(s): LABPROT, INR in the last 72 hours. CMP     Component Value Date/Time   NA 144 04/01/2020 0350   K 3.1 (L) 04/01/2020 0350   CL 111 04/01/2020 0350   CO2 24 04/01/2020 0350   GLUCOSE 125 (H) 04/01/2020 0350   BUN 19 04/01/2020 0350   CREATININE 0.47 (L) 04/01/2020 0350   CALCIUM 8.7 (L) 04/01/2020 0350   PROT 4.6 (L) 03/25/2020 0621   ALBUMIN 2.4 (L) 03/25/2020 0621   AST 15 03/25/2020 0621   ALT 11 03/25/2020 0621   ALKPHOS 70 03/25/2020 0621   BILITOT 0.7 03/25/2020 0621   GFRNONAA >60 04/01/2020 0350   GFRAA >60 04/01/2020 0350   Lipase     Component Value Date/Time   LIPASE 15 03/12/2020 1104       Studies/Results: IR GJ Tube Change  Result Date:  04/01/2020 INDICATION: 74 year old male with a history of surgically placed gastrojejunostomy, pyloroplasty, resection third and fourth portion of the duodenum twelve days ago with nonfunctional GJ tube. EXAM: IMAGE GUIDED REPLACEMENT OF GASTROJEJUNOSTOMY MEDICATIONS: None ANESTHESIA/SEDATION: None CONTRAST:  48mL OMNIPAQUE IOHEXOL 300 MG/ML SOLN - administered into the gastric lumen. FLUOROSCOPY TIME:  Fluoroscopy Time: 4 minutes 24 seconds (15 mGy). COMPLICATIONS: None PROCEDURE: Informed written consent was obtained from the patient and the patient's family after a thorough discussion of the procedural risks, benefits and alternatives. All questions were addressed. Maximal Sterile Barrier Technique was utilized including caps, mask, sterile gowns, sterile gloves, sterile drape, hand hygiene and skin antiseptic. A timeout was performed prior to the initiation of the procedure. The epigastrium was prepped with Betadine in a sterile fashion, and a sterile drape was applied covering the operative field. A sterile gown and sterile gloves were used for the procedure. The surgically placed GJ tube was interrogated with attempted infusion of contrast through the occluded jejunostomy port. This resulted in damage to the wall of the proximal catheter at the hub. The lumen for the stomach was not occluded and contrast free flow Lea into the stomach lumen. We then placed a stiff Glidewire through the lumen of the jejunostomy,  and the wire exited the side hole in the proximal jejunostomy limb. This was then passed parallel to the jejunostomy limb within the duodenum, achieving a position within the lumen of the transition 2/3 portion of the duodenum. The retention sutures at the skin were then ligated and removed. The balloon was completely deflated. The surgical and nonfunctional GJ tube was then removed from the Glidewire with the Glidewire maintained within the proximal small bowel. A new 18 French 58 cm gastrojejunostomy  tube was modified with amputation of the distal 15 cm. Catheter was placed over the Glidewire into the proximal small bowel. The back end of a standard Glidewire was used to unfurl the redundant jejunal limb. 7 cc of saline used to inflate the balloon. Both the stomach lumen and the jejunal lumen were confirmed to be patent and within the appropriate location with infusion of small amount of contrast. Both limbs were flushed. The bumper was then advanced to the skin surface. Patient tolerated the procedure well and remained hemodynamically stable throughout. No complications were encountered and no significant blood loss. The stomach was distended with gas. Under fluoroscopic guidance, an 18 gauge needle was utilized to puncture the anterior wall of the body of the stomach. An Amplatz wire was advanced through the needle passing a T fastener into the lumen of the stomach. The T fastener was secured for gastropexy. A 9-French sheath was inserted. A snare was advanced through the 9-French sheath. A Britta Mccreedy was advanced through the orogastric tube. It was snared then pulled out the oral cavity, pulling the snare, as well. The leading edge of the gastrostomy was attached to the snare. It was then pulled down the esophagus and out the percutaneous site. Tube secured in place. Contrast was injected. Patient tolerated the procedure well and remained hemodynamically stable throughout. No complications were encountered and no significant blood loss encountered. FINDINGS: The new 58 cm 18 French GJ tube terminates within the proximal small bowel. The final 15 cm were amputated. If a future exchanges required with a similar GJ tube, would amputate the distal 20-25 cm. IMPRESSION: Status post exchange of a nonfunctional surgically placed gastrojejunostomy tube with placement of a new 18 French GJ, modified as above. Signed, Dulcy Fanny. Earleen Newport, DO [facility] Radiology Electronically  Signed   By: Corrie Mckusick D.O.   On: 04/01/2020 16:13    Anti-infectives: Anti-infectives (From admission, onward)   Start     Dose/Rate Route Frequency Ordered Stop   03/20/20 1900  ciprofloxacin (CIPRO) IVPB 400 mg     400 mg 200 mL/hr over 60 Minutes Intravenous Every 12 hours 03/20/20 1829 03/20/20 2136   03/20/20 0600  ciprofloxacin (CIPRO) IVPB 400 mg     400 mg 200 mL/hr over 60 Minutes Intravenous On call to O.R. 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  Restart tube feeds.  Start cycling.  Do slow g tube clamping, though will take a while as patient is still getting a very full feeling.     FEN: TF's, CLD, IVF VTE: SCD's, Lovenox ID: Cipro periop   LOS: 13 days   Milus Height, MD FACS Surgical Oncology, General Surgery, Trauma and [facility], [place] for weekday/non  holidays Check [email or web address] for coverage night/weekend/holidays  Do not use SecureChat as it is not reliable for patient care.

## 2020-04-02 NOTE — Progress Notes (Signed)
Physical Therapy Treatment Patient Details Name: Albert Hartman MRN: [id number] DOB: 13-Jul-1946 Today's Date: 04/02/2020    History of Present Illness Pt is a 74 y.o. male admitted 03/20/20 with unintentional weight loss, decreased appetite and nausea/vomiting. Found to have duodenal obstruction suspecisious for cancer. S/p diagnostic laparoscopy, resection of 3rd/4th portion of duodenum, gastrojejunostomy, GJ tube, pyloroplasty 4/29. 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 S/P surgical exchange of GJ tube 04/01/20. PMH includes HTN, OSA, reflux.    PT Comments    Pt motivated to get up and walk. Pt supervision for all functional mobility and amb around unit. Pt amb increased distance with RW, supervision requiring assistance only to push lines. Discussed updating d/c recommendations to no physical therapy to follow up upon d/c home, pt agreed with recommendation. Will continue to follow acutely.   Follow Up Recommendations  No PT follow up     Equipment Recommendations  None recommended by PT    Recommendations for Other Services       Precautions / Restrictions Precautions Precautions: Fall Precaution Comments: Multiple drains/tubes (gastrostomy, GJ tube, LLQ drain), abdominal precautions for comfort Restrictions Weight Bearing Restrictions: No Other Position/Activity Restrictions: abdominal precautions    Mobility  Bed Mobility Overal bed mobility: Needs Assistance       Supine to sit: Supervision;HOB elevated     General bed mobility comments: OOB in recliner upon entry   Transfers Overall transfer level: Needs assistance Equipment used: Rolling walker (2 wheeled) Transfers: Sit to/from Stand Sit to Stand: Supervision         General transfer comment: for safety/balance, good hand placement and safety  Ambulation/Gait Ambulation/Gait assistance: Supervision Gait Distance (Feet): 580 Feet Assistive device: Rolling walker (2  wheeled) Gait Pattern/deviations: Step-through pattern;Decreased stride length     General Gait Details: Pt gait speed increased with decreased assistance   Stairs             Wheelchair Mobility    Modified Rankin (Stroke Patients Only)       Balance Overall balance assessment: Needs assistance Sitting-balance support: No upper extremity supported;Feet supported Sitting balance-Leahy Scale: Good Sitting balance - Comments: supervision   Standing balance support: Bilateral upper extremity supported;During functional activity Standing balance-Leahy Scale: Fair Standing balance comment: supervision for safety                            Cognition Arousal/Alertness: Awake/alert Behavior During Therapy: WFL for tasks assessed/performed Overall Cognitive Status: Within Functional Limits for tasks assessed                                        Exercises      General Comments General comments (skin integrity, edema, etc.): Pt drains and incisions appeared WNL with no drainage present pre and post amb.      Pertinent Vitals/Pain Pain Assessment: No/denies pain Faces Pain Scale: Hurts a little bit Pain Location: abdomen Pain Descriptors / Indicators: Grimacing;Discomfort Pain Intervention(s): Limited activity within patient's tolerance;Monitored during session;Repositioned    Home Living                      Prior Function            PT Goals (current goals can now be found in the care plan section) Acute Rehab PT Goals Patient Stated  Goal: To return to independence PT Goal Formulation: With patient Time For Goal Achievement: 04/04/20 Potential to Achieve Goals: Good Progress towards PT goals: Progressing toward goals    Frequency    Min 3X/week      PT Plan Current plan remains appropriate;Discharge plan needs to be updated    Co-evaluation              AM-PAC PT "6 Clicks" Mobility   Outcome Measure   Help needed turning from your back to your side while in a flat bed without using bedrails?: None Help needed moving from lying on your back to sitting on the side of a flat bed without using bedrails?: None Help needed moving to and from a bed to a chair (including a wheelchair)?: None Help needed standing up from a chair using your arms (e.g., wheelchair or bedside chair)?: None Help needed to walk in hospital room?: None Help needed climbing 3-5 steps with a railing? : A Lot 6 Click Score: 22    End of Session   Activity Tolerance: Patient tolerated treatment well Patient left: in chair;with call bell/phone within reach Nurse Communication: Mobility status PT Visit Diagnosis: Other abnormalities of gait and mobility (R26.89);Pain;Muscle weakness (generalized) (M62.81)     Time: [id number] PT Time Calculation (min) (ACUTE ONLY): 35 min  Charges:  $Gait Training: 8-22 mins $Therapeutic Exercise: 8-22 mins                     [employer] SPT 04/02/2020    Rolland Porter 04/02/2020, 3:56 PM

## 2020-04-02 NOTE — Progress Notes (Signed)
Occupational Therapy Treatment Patient Details Name: Albert Hartman MRN: [id number] DOB: 02/10/1946 Today's Date: 04/02/2020    History of present illness Pt is a 74 y.o. male admitted 03/20/20 with unintentional weight loss, decreased appetite and nausea/vomiting. Found to have duodenal obstruction suspecisious for cancer. S/p diagnostic laparoscopy, resection of 3rd/4th portion of duodenum, gastrojejunostomy, GJ tube, pyloroplasty 4/29. 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 S/P surgical exchange of GJ tube 04/01/20. PMH includes HTN, OSA, reflux.   OT comments  Patient seated in recliner and agreeable to OT session. Limited by generalized weakness and decreased activity tolerance, fatigues easily.  Completing LB ADLs with min guard (figure 4 technique to reach feet), min guard for grooming at sink, toilet transfers and in room mobility using RW.  Will follow acutely.  Recommendations remain appropriate.    Follow Up Recommendations  No OT follow up;Supervision - Intermittent    Equipment Recommendations  None recommended by OT    Recommendations for Other Services      Precautions / Restrictions Precautions Precautions: Fall Precaution Comments: Multiple drains/tubes (gastrostomy, GJ tube, RLQ drain, LLQ drain), condom cath, abdominal precautions for comfort Restrictions Weight Bearing Restrictions: No Other Position/Activity Restrictions: abdominal precautions       Mobility Bed Mobility               General bed mobility comments: OOB in recliner upon entry   Transfers Overall transfer level: Needs assistance Equipment used: Rolling walker (2 wheeled) Transfers: Sit to/from Stand Sit to Stand: Min guard         General transfer comment: for safety/balance, good hand placement and safety    Balance Overall balance assessment: Needs assistance Sitting-balance support: No upper extremity supported;Feet supported Sitting  balance-Leahy Scale: Fair Sitting balance - Comments: supervision   Standing balance support: Bilateral upper extremity supported;No upper extremity supported;During functional activity Standing balance-Leahy Scale: Fair Standing balance comment:  min guard for safety                            ADL either performed or assessed with clinical judgement   ADL Overall ADL's : Needs assistance/impaired     Grooming: Supervision/safety;Standing;Wash/dry hands               Lower Body Dressing: Min guard;Sit to/from stand Lower Body Dressing Details (indicate cue type and reason): figure 4 technique to manage socks, min guard sit to stand  Toilet Transfer: Min guard;Ambulation;RW;Regular Toilet;BSC Toilet Transfer Details (indicate cue type and reason): 3:1 over commode using RW          Functional mobility during ADLs: Min guard;Rolling walker General ADL Comments: pt limited by weakness and decreased activity tolerance, fatigues quickly     Vision       Perception     Praxis      Cognition Arousal/Alertness: Awake/alert Behavior During Therapy: WFL for tasks assessed/performed Overall Cognitive Status: Within Functional Limits for tasks assessed                                          Exercises     Shoulder Instructions       General Comments VSS during session    Pertinent Vitals/ Pain       Pain Assessment: Faces Faces Pain Scale: Hurts a little bit Pain Location: abdomen Pain  Descriptors / Indicators: Grimacing;Discomfort Pain Intervention(s): Limited activity within patient's tolerance;Monitored during session;Repositioned  Home Living                                          Prior Functioning/Environment              Frequency  Min 3X/week        Progress Toward Goals  OT Goals(current goals can now be found in the care plan section)  Progress towards OT goals: Progressing toward  goals  Acute Rehab OT Goals Patient Stated Goal: To return to independence OT Goal Formulation: With patient  Plan Discharge plan remains appropriate;Frequency remains appropriate    Co-evaluation                 AM-PAC OT "6 Clicks" Daily Activity     Outcome Measure   Help from another person eating meals?: A Little(liquids only) Help from another person taking care of personal grooming?: A Little Help from another person toileting, which includes using toliet, bedpan, or urinal?: A Little Help from another person bathing (including washing, rinsing, drying)?: A Little Help from another person to put on and taking off regular upper body clothing?: A Little Help from another person to put on and taking off regular lower body clothing?: A Little 6 Click Score: 18    End of Session Equipment Utilized During Treatment: Rolling walker;Gait belt  OT Visit Diagnosis: Unsteadiness on feet (R26.81);Other abnormalities of gait and mobility (R26.89);Muscle weakness (generalized) (M62.81);Pain Pain - part of body: (abdomen)   Activity Tolerance Patient tolerated treatment well   Patient Left in chair;with call bell/phone within reach   Nurse Communication Mobility status        Time: [id number] OT Time Calculation (min): 17 min  Charges: OT General Charges $OT Visit: 1 Visit OT Treatments $Self Care/Home Management : 8-22 mins  Jolaine Artist, OT [place] Pager [phone] Office [phone]    [name] 04/02/2020, 1:00 PM

## 2020-04-02 NOTE — Progress Notes (Signed)
Staples removed, applied benzoin and steri strips as ordered. Patient tolerated well.

## 2020-04-02 NOTE — TOC Progression Note (Signed)
Transition of Care [facility]) - Progression Note    Patient Details  Name: Albert Hartman MRN: [id number] Date of Birth: 1946/11/04  Transition of [facility]) CM/SW Contact  Jacalyn Lefevre Edson Snowball, RN Phone Number: 04/02/2020, 4:08 PM  Clinical Narrative:     Talked to patient at bedside. His first choice for home health RN is [place]. NCM made referral to Butch Penny with [facility] awaiting call back. Patient asked NCM to his wife once [facility] has made decision.  Expected Discharge Plan: [place] Barriers to Discharge: Continued Medical Work up  Expected Discharge Plan and Services Expected Discharge Plan: [place]   Discharge Planning Services: CM Consult Post Acute Care Choice: [employer] arrangements for the past 2 months: Single Family Home                           HH Arranged: PT, RN           Social Determinants of Health (SDOH) Interventions    Readmission Risk Interventions No flowsheet data found.

## 2020-04-02 NOTE — Progress Notes (Signed)
Pt has home CPAP at bedside. Advised pt to notify for RT if any further assistance is needed.

## 2020-04-02 NOTE — Progress Notes (Addendum)
Nutrition Follow-up  RD working remotely.  DOCUMENTATION CODES:   Severe malnutrition in context of chronic illness  INTERVENTION:   -ContinueBoost Breeze po TID, each supplement provides 250 kcal and 9 grams of protein -Continue Pivot 1.5 @60  ml/hrvia j-tube Provides:2340kcal, 146grams protein, and 1143m free water. -At 1400, transition to nocturnal feedings:  Osmolite 1.5 (formula change due to insurance reimbursement) @ 75 ml/hr via j-tube over 20 hour period (ex 1400-1000)  Continue with 30 ml free water flush every 4 hours for tube patency  Tube feeding regimen provides 2250 kcal (100% of needs), 94 grams of protein, and 1143 ml of H2O. Total free water: 1323 ml  NUTRITION DIAGNOSIS:   Severe Malnutrition related to chronic illness(bowel obstruction) as evidenced by energy intake < or equal to 75% for > or equal to 1 month, percent weight loss.  Ongoing  GOAL:   Patient will meet greater than or equal to 90% of their needs  Met with TF  MONITOR:   I & O's  REASON FOR ASSESSMENT:   Consult Enteral/tube feeding initiation and management  ASSESSMENT:   Pt with PMH of HTN and GERD who was dx with duodenal mass/stricture now admitted for surgery now s/p resection of 3rd, 4th portion of duodenum, gastrojejunostomy, GJ tube placement.  Reviewed I/O's: -1.1 L x 24 hours and -18.5 L since admission  UOP: 550 ml x 24 hours  Drain output: 3.9 L x 24 hours  Attempted to speak with pt via phone, however, no answer.   G-J tube exchanged yesterday.  Per MD notes, pt has extremely early satiety and feels full wen g-tube is not clamped. Plan to perform slow clamping cycles.   Plan to re-start TF today. Per MD notes, desire to transition to cyclic TF. TF currently infusing via j-tube at 60 ml/hr, which provides 2340kcal, 146grams protein, and 11861mfree water, meeting 100% of estimated kcal and protein needs.   Case discussed with PaCarolynn Sayersrom AdRubySecondary to insurance reimbursement, pt will need to transition to a standard formula for home. Will modify TF orders to Osmolite 1.5. Pt to transition to nocturnal feedings of Osmolite 1.5 @ 75 ml/hr over 20 hours, which provides 2250 kcals, 94 grams protein, and 1143 ml free water daily, meeting 100% of estimated kcal needs and 90% of estimated protein needs.   Medications reviewed and include dextrose 5%-0.9% sodium chloride infusion @ 100 ml/hr.   Labs reviewed: CBGS: 104-121.   Diet Order:   Diet Order            Diet clear liquid Room service appropriate? Yes; Fluid consistency: Thin  Diet effective now              EDUCATION NEEDS:   No education needs have been identified at this time  Skin:  Skin Assessment: Skin Integrity Issues: Skin Integrity Issues:: Incisions Incisions: closed abdomen  Last BM:  04/02/20  Height:   Ht Readings from Last 1 Encounters:  03/20/20 6' 1.5" (1.867 m)    Weight:   Wt Readings from Last 1 Encounters:  04/02/20 90.1 kg    Ideal Body Weight:  86.3 kg  BMI:  Body mass index is 25.85 kg/m.  Estimated Nutritional Needs:   Kcal:  227902-4097Protein:  105-120 grams  Fluid:  > 2.2 L    JeLoistine ChanceRD, LDN, CDCliftonegistered Dietitian II Certified Diabetes Care and Education Specialist Please refer to [facility]Prov[facility] Hospitalor RD and/or RD on-call/weekend/after hours pager

## 2020-04-03 LAB — GLUCOSE, CAPILLARY
Glucose-Capillary: 103 mg/dL — ABNORMAL HIGH (ref 70–99)
Glucose-Capillary: 104 mg/dL — ABNORMAL HIGH (ref 70–99)
Glucose-Capillary: 104 mg/dL — ABNORMAL HIGH (ref 70–99)
Glucose-Capillary: 93 mg/dL (ref 70–99)
Glucose-Capillary: 115 mg/dL — ABNORMAL HIGH (ref 70–99)

## 2020-04-03 MED ORDER — SODIUM CHLORIDE 0.9% FLUSH
3.0000 mL | Freq: Two times a day (BID) | INTRAVENOUS | Status: DC
  Administered 2020-04-03 – 2020-04-05 (×4): 3 mL via INTRAVENOUS

## 2020-04-03 MED ORDER — PIVOT 1.5 CAL PO LIQD
1500.0000 mL | ORAL | Status: AC
  Filled 2020-04-03: qty 2000

## 2020-04-03 MED ORDER — SODIUM CHLORIDE 0.9% FLUSH: 3.0000 mL | INTRAVENOUS | Status: DC | PRN

## 2020-04-03 NOTE — Progress Notes (Signed)
    14 Days Post-Op  Subjective: CC: Tolerated initiation of cycling of tube feeds.  Had one episode of just a little emesis.  Having green and brown bowel movements.  Got staples and right drain out yesterday.    Objective: Vital signs in last 24 hours: Temp:  [97.8 F (36.6 C)-98.7 F (37.1 C)] 97.8 F (36.6 C) (05/13 1452) Pulse Rate:  [84-93] 84 (05/13 1452) Resp:  [16-20] 17 (05/13 1452) BP: (137-148)/(67-78) 148/78 (05/13 1452) SpO2:  [99 %-100 %] 99 % (05/13 1452) Weight:  [88.6 kg] 88.6 kg (05/13 0354) Last BM Date: 03/30/20  Intake/Output from previous day: 05/12 0701 - 05/13 0700 In: 4159.5 [P.O.:120; I.V.:2400.8; NG/GT:1638.8] Out: [id number] [Urine:650; Drains:5400] Intake/Output this shift: Total I/O In: 465 [NG/GT:465] Out: 150 [Emesis/NG output:150]  PE: Gen: comfortable, no distress Pulm: normal rate and effort  Abd: soft, ND, NT, JP with left drain sl murky, but minimal output.  Right drain out. Extr: wwp, no edema   Lab Results:  Recent Labs    04/01/20 0350  WBC 14.7*  HGB 9.8*  HCT 31.3*  PLT 621*   BMET Recent Labs    04/01/20 0350  NA 144  K 3.1*  CL 111  CO2 24  GLUCOSE 125*  BUN 19  CREATININE 0.47*  CALCIUM 8.7*   PT/INR No results for input(s): LABPROT, INR in the last 72 hours. CMP     Component Value Date/Time   NA 144 04/01/2020 0350   K 3.1 (L) 04/01/2020 0350   CL 111 04/01/2020 0350   CO2 24 04/01/2020 0350   GLUCOSE 125 (H) 04/01/2020 0350   BUN 19 04/01/2020 0350   CREATININE 0.47 (L) 04/01/2020 0350   CALCIUM 8.7 (L) 04/01/2020 0350   PROT 4.6 (L) 03/25/2020 0621   ALBUMIN 2.4 (L) 03/25/2020 0621   AST 15 03/25/2020 0621   ALT 11 03/25/2020 0621   ALKPHOS 70 03/25/2020 0621   BILITOT 0.7 03/25/2020 0621   GFRNONAA >60 04/01/2020 0350   GFRAA >60 04/01/2020 0350   Lipase     Component Value Date/Time   LIPASE 15 03/12/2020 1104       Studies/Results: No results  found.  Anti-infectives: Anti-infectives (From admission, onward)   Start     Dose/Rate Route Frequency Ordered Stop   03/20/20 1900  ciprofloxacin (CIPRO) IVPB 400 mg     400 mg 200 mL/hr over 60 Minutes Intravenous Every 12 hours 03/20/20 1829 03/20/20 2136   03/20/20 0600  ciprofloxacin (CIPRO) IVPB 400 mg     400 mg 200 mL/hr over 60 Minutes Intravenous On call to O.R. 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  Cycle tube feeds.  Hopefully tomorrow can do 2 hours on and 2 hours off for g tube clamping.  Anticipate home Monday with home health.    FEN: TF's, CLD, IVF VTE: SCD's, Lovenox ID: Cipro periop   LOS: 14 days   Milus Height, MD FACS Surgical Oncology, General Surgery, Trauma and [place] [facility], [place] [phone] for weekday/non holidays Check [email or web address] for coverage night/weekend/holidays  Do not use SecureChat as it is not reliable for patient care.

## 2020-04-03 NOTE — Progress Notes (Signed)
Nutrition Follow-up  DOCUMENTATION CODES:   Severe malnutrition in context of chronic illness  INTERVENTION:   -Continue Boost Breeze po TID, each supplement provides 250 kcal and 9 grams of protein -Continue nocturnal feedings: Osmolite 1.5  @ 75 ml/hr via j-tube over 20 hour period (ex 1400-1000)  Continue with 30 ml free water flush every 4 hours for tube patency  Tube feeding regimen provides 2250 kcal (100% of needs), 94 grams of protein, and 1143 ml of H2O. Total free water: 1323 ml  NUTRITION DIAGNOSIS:   Severe Malnutrition related to chronic illness(bowel obstruction) as evidenced by energy intake < or equal to 75% for > or equal to 1 month, percent weight loss.  Ongoing  GOAL:   Patient will meet greater than or equal to 90% of their needs  Progressing   MONITOR:   I & O's  REASON FOR ASSESSMENT:   Consult Enteral/tube feeding initiation and management  ASSESSMENT:   Pt with PMH of HTN and GERD who was dx with duodenal mass/stricture now admitted for surgery now s/p resection of 3rd, 4th portion of duodenum, gastrojejunostomy, GJ tube placement.  Reviewed I/O's: -1.9 L x 24 hours and -20.4 L since admission  UOP: 650 ml x 24 hours  Emesis: 150 ml x 24 hours  Drain output: 5.4 L x 24 hours  Pt resting in recliner chair, talking on phone at time of visit. No family present at time of visit.   Pt remains on clamping trials; remains disappointed that he is unable to clamp tube for 4 hour goal.   Case discussed with RNCM regarding TF orders for home (have also been communicating with Carolynn Sayers of [facility]). Plan to transition to Osmolite 1.5 formula today. Plan to discharge home with home health, possibly Monday.   Labs reviewed: CBGS: 103-104.   Diet Order:   Diet Order            Diet clear liquid Room service appropriate? Yes; Fluid consistency: Thin  Diet effective now              EDUCATION NEEDS:   No education needs have been  identified at this time  Skin:  Skin Assessment: Skin Integrity Issues: Skin Integrity Issues:: Incisions Incisions: closed abdomen  Last BM:  04/03/20  Height:   Ht Readings from Last 1 Encounters:  03/20/20 6' 1.5" (1.867 m)    Weight:   Wt Readings from Last 1 Encounters:  04/03/20 88.6 kg    Ideal Body Weight:  86.3 kg  BMI:  Body mass index is 25.42 kg/m.  Estimated Nutritional Needs:   Kcal:  [id number]  Protein:  105-120 grams  Fluid:  > 2.2 L    Loistine Chance, RD, LDN, [place] Registered Dietitian II Certified Diabetes Care and Education Specialist Please refer to [facility] for RD and/or RD on-call/weekend/after hours pager

## 2020-04-03 NOTE — Care Management (Addendum)
Albert Hartman with [facility] has accepted the [facility] referral, however start of care will not be until MOnday. WIll continue to follow, patient's progression.   Patient and sister at bedside aware. Patient consented for NCM to call his wife. Called [facility] [phone] and left a message. Awaiting call back.  Magdalen Spatz RN

## 2020-04-03 NOTE — Progress Notes (Signed)
PT Cancellation Note  Patient Details Name: Albert Hartman MRN: [id number] DOB: 05/13/1946   Cancelled Treatment:    Reason Eval/Treat Not Completed: Other (comment). Pt reported today is a bad day, not feeling well and just started tube feed. Will reattempt as schedule permits.  Rolland Porter SPT 04/03/2020   Rolland Porter 04/03/2020, 1:59 PM

## 2020-04-04 LAB — BASIC METABOLIC PANEL
Anion gap: 8 (ref 5–15)
BUN: 25 mg/dL — ABNORMAL HIGH (ref 8–23)
CO2: 24 mmol/L (ref 22–32)
Calcium: 9.2 mg/dL (ref 8.9–10.3)
Chloride: 113 mmol/L — ABNORMAL HIGH (ref 98–111)
Creatinine, Ser: 0.65 mg/dL (ref 0.61–1.24)
GFR calc Af Amer: >60 mL/min (ref >60)
GFR calc non Af Amer: >60 mL/min (ref >60)
Glucose, Bld: 109 mg/dL — ABNORMAL HIGH (ref 70–99)
Potassium: 3.1 mmol/L — ABNORMAL LOW (ref 3.5–5.1)
Sodium: 145 mmol/L (ref 135–145)

## 2020-04-04 LAB — GLUCOSE, CAPILLARY
Glucose-Capillary: 100 mg/dL — ABNORMAL HIGH (ref 70–99)
Glucose-Capillary: 103 mg/dL — ABNORMAL HIGH (ref 70–99)
Glucose-Capillary: 104 mg/dL — ABNORMAL HIGH (ref 70–99)
Glucose-Capillary: 110 mg/dL — ABNORMAL HIGH (ref 70–99)
Glucose-Capillary: 115 mg/dL — ABNORMAL HIGH (ref 70–99)
Glucose-Capillary: 118 mg/dL — ABNORMAL HIGH (ref 70–99)

## 2020-04-04 LAB — CBC
HCT: 37.1 % — ABNORMAL LOW (ref 39.0–52.0)
Hemoglobin: 11.6 g/dL — ABNORMAL LOW (ref 13.0–17.0)
MCH: 29.1 pg (ref 26.0–34.0)
MCHC: 31.3 g/dL (ref 30.0–36.0)
MCV: 93 fL (ref 80.0–100.0)
Platelets: 530 10*3/uL — ABNORMAL HIGH (ref 150–400)
RBC: 3.99 MIL/uL — ABNORMAL LOW (ref 4.22–5.81)
RDW: 15.8 % — ABNORMAL HIGH (ref 11.5–15.5)
WBC: 12.5 10*3/uL — ABNORMAL HIGH (ref 4.0–10.5)
nRBC: 0 % (ref 0.0–0.2)

## 2020-04-04 NOTE — Progress Notes (Signed)
Patient able to placed himself on home CPAP unit.

## 2020-04-04 NOTE — Progress Notes (Signed)
    15 Days Post-Op  Subjective: CC: Had an episode of emesis yesterday despite g tube being open to gravity.  Objective: Vital signs in last 24 hours: Temp:  [97.7 F (36.5 C)-97.9 F (36.6 C)] 97.7 F (36.5 C) (05/14 0443) Pulse Rate:  [84-89] 84 (05/14 0443) Resp:  [17] 17 (05/14 0443) BP: (147-155)/(67-78) 155/67 (05/14 0443) SpO2:  [98 %-100 %] 100 % (05/14 0443) Weight:  [87.9 kg] 87.9 kg (05/14 0443) Last BM Date: 03/30/20  Intake/Output from previous day: 05/13 0701 - 05/14 0700 In: 1747.5 [P.O.:120; NG/GT:1627.5] Out: 3430 [Emesis/NG output:650; Drains:2780] Intake/Output this shift: No intake/output data recorded.  PE: Gen: comfortable, no distress Pulm: normal rate and effort  Abd: soft, ND, NT, JP with left drain scant output.  Right drain out. Extr: wwp, no edema   Lab Results:  Recent Labs    04/04/20 0716  WBC 12.5*  HGB 11.6*  HCT 37.1*  PLT 530*   BMET Recent Labs    04/04/20 0716  NA 145  K 3.1*  CL 113*  CO2 24  GLUCOSE 109*  BUN 25*  CREATININE 0.65  CALCIUM 9.2   PT/INR No results for input(s): LABPROT, INR in the last 72 hours. CMP     Component Value Date/Time   NA 145 04/04/2020 0716   K 3.1 (L) 04/04/2020 0716   CL 113 (H) 04/04/2020 0716   CO2 24 04/04/2020 0716   GLUCOSE 109 (H) 04/04/2020 0716   BUN 25 (H) 04/04/2020 0716   CREATININE 0.65 04/04/2020 0716   CALCIUM 9.2 04/04/2020 0716   PROT 4.6 (L) 03/25/2020 0621   ALBUMIN 2.4 (L) 03/25/2020 0621   AST 15 03/25/2020 0621   ALT 11 03/25/2020 0621   ALKPHOS 70 03/25/2020 0621   BILITOT 0.7 03/25/2020 0621   GFRNONAA >60 04/04/2020 0716   GFRAA >60 04/04/2020 0716   Lipase     Component Value Date/Time   LIPASE 15 03/12/2020 1104       Studies/Results: No results found.  Anti-infectives: Anti-infectives (From admission, onward)   Start     Dose/Rate Route Frequency Ordered Stop   03/20/20 1900  ciprofloxacin (CIPRO) IVPB 400 mg     400 mg 200  mL/hr over 60 Minutes Intravenous Every 12 hours 03/20/20 1829 03/20/20 2136   03/20/20 0600  ciprofloxacin (CIPRO) IVPB 400 mg     400 mg 200 mL/hr over 60 Minutes Intravenous On call to O.R. 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  Delayed gastric emptying vs gastroparesis Cycle tube feeds.  Anticipate home Monday with home health.    FEN: TF's, CLD, IVF VTE: SCD's, Lovenox ID: Cipro periop   LOS: 15 days   Milus Height, MD FACS Surgical Oncology, General Surgery, Trauma and [place] [facility], [place] [phone] for weekday/non holidays Check [email or web address] for coverage night/weekend/holidays  Do not use SecureChat as it is not reliable for patient care.

## 2020-04-04 NOTE — Progress Notes (Signed)
Occupational Therapy Treatment Patient Details Name: Albert Hartman MRN: [id number] DOB: Sep 12, 1946 Today's Date: 04/04/2020    History of present illness Pt is a 74 y.o. male admitted 03/20/20 with unintentional weight loss, decreased appetite and nausea/vomiting. Found to have duodenal obstruction suspecisious for cancer. S/p diagnostic laparoscopy, resection of 3rd/4th portion of duodenum, gastrojejunostomy, GJ tube, pyloroplasty 4/29. 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 S/P surgical exchange of GJ tube 04/01/20. PMH includes HTN, OSA, reflux.   OT comments  Today, pt slightly agitated secondary to being eager to discharge home. However, pt agreeable to participating in OT treatment, wife/sister present. Assessed functional transfer, functional mobility, RW management, line management, ADL function including grooming and toileting. Pt found sitting in chair. Pt aware of drains and managed carefully. Pt required supervision for functional transfers/mobility throughout room using RW. Pt transferred on/off toilet with Supervision using RW and toileting rails for support. Pt required min verbal cues for safe RW management in preparation for standing>sitting on surface. Pt demonstrated the ability to stand at sink to manipulate grooming materials while simultaneously maintaining good dynamic standing balance without LOB. Pt/family confirmed having all necessary AE/DME at home already. Re-educated pt on energy conservation and fall prevention strategies. Pt is safe to return home at Supervision level for ADLs/functional mobility.    Follow Up Recommendations  No OT follow up;Supervision - Intermittent    Equipment Recommendations  None recommended by OT    Recommendations for Other Services      Precautions / Restrictions Precautions Precautions: Fall Precaution Comments: Multiple drains/tubes (gastrostomy,  LLQ drain), abdominal precautions for  comfort Restrictions Weight Bearing Restrictions: No Other Position/Activity Restrictions: abdominal precautions       Mobility Bed Mobility                  Transfers Overall transfer level: Needs assistance(Supervison for line management/drain management) Equipment used: Rolling walker (2 wheeled) Transfers: Sit to/from Stand Sit to Stand: Supervision         General transfer comment: requires supervison/min guard to manage lines/drains; good hand placement ; pt tolerated ambulating around room with Supervison using RW    Balance Overall balance assessment: Independent Sitting-balance support: No upper extremity supported;Feet supported Sitting balance-Leahy Scale: Normal Sitting balance - Comments: Independent in chair   Standing balance support: Bilateral upper extremity supported;During functional activity Standing balance-Leahy Scale: Good Standing balance comment: Pt uses RW for comfort, Pt able to maintain balance and amb without RW.                           ADL either performed or assessed with clinical judgement   ADL Overall ADL's : Needs assistance/impaired(Pt requires supervision for safety during ADLs)     Grooming: Supervision/safety;Oral care;Wash/dry hands;Standing Grooming Details (indicate cue type and reason): Pt simulated grooming tasks while standing at sink, demonstrating the ability to manipulate all grooming materials while simultaneously maintaining dynamic standing balance using RW for support                 Toilet Transfer: Supervision/safety;Ambulation;RW;Grab bars(BSC placed over toilet) Toilet Transfer Details (indicate cue type and reason): Pt required min verbal cues for safe RW/line management         Functional mobility during ADLs: Min guard;Rolling walker General ADL Comments: Pt is able to perform functional mobility during ADLs at Supervision level however Min Guard used secondary to drain placement/IV  pole  Vision       Perception     Praxis      Cognition Arousal/Alertness: Awake/alert Behavior During Therapy: WFL for tasks assessed/performed Overall Cognitive Status: Within Functional Limits for tasks assessed                                 General Comments: Pt somewhat agitated secondary to being eager to return home; pt willing to participate in self-care tasks and ambulate throughout bedroom        Exercises General Exercises - Lower Extremity Mini-Sqauts: AROM;Standing;Other reps (comment)(1x5, 1x6)   Shoulder Instructions       General Comments Attempted to amb without RW today, pt able to amb without RW but reports he feels better with use of RW.    Pertinent Vitals/ Pain       Pain Assessment: No/denies pain Faces Pain Scale: No hurt Pain Intervention(s): Limited activity within patient's tolerance  Home Living                                          Prior Functioning/Environment              Frequency           Progress Toward Goals  OT Goals(current goals can now be found in the care plan section)  Progress towards OT goals: Goals met/education completed, patient discharged from OT  Acute Rehab OT Goals Patient Stated Goal: To return to independence  Plan Discharge plan remains appropriate    Co-evaluation                 AM-PAC OT "6 Clicks" Daily Activity     Outcome Measure   Help from another person eating meals?: None Help from another person taking care of personal grooming?: None Help from another person toileting, which includes using toliet, bedpan, or urinal?: None Help from another person bathing (including washing, rinsing, drying)?: A Little Help from another person to put on and taking off regular upper body clothing?: A Little Help from another person to put on and taking off regular lower body clothing?: None 6 Click Score: 22    End of Session Equipment Utilized During  Treatment: Gait belt;Rolling walker  OT Visit Diagnosis: Unsteadiness on feet (R26.81);Other abnormalities of gait and mobility (R26.89);Muscle weakness (generalized) (M62.81);Pain   Activity Tolerance Patient tolerated treatment well   Patient Left in chair;with call bell/phone within reach;with family/visitor present   Nurse Communication Mobility status        Time: 1359-1419 OT Time Calculation (min): 20 min  Charges: OT General Charges $OT Visit: 1 Visit OT Treatments $Self Care/Home Management : 8-22 mins  Michel Bickers, OTR/L [facility] [place] 04/04/2020, 4:14 PM

## 2020-04-04 NOTE — Progress Notes (Signed)
Nutrition Follow-up  DOCUMENTATION CODES:   Severe malnutrition in context of chronic illness  INTERVENTION:   -Continue Boost Breeze po TID, each supplement provides 250 kcal and 9 grams of protein -Continue nocturnal feedings: Osmolite 1.5 @ 43ml/hr via j-tube over 20 hour period (ex 1400-1000)  Continue with 30 ml free water flush every 4 hours for tube patency  Tube feeding regimen provides2250kcal (100% of needs),94grams of protein, and 1125ml of H2O. Total free water: 1323 ml  NUTRITION DIAGNOSIS:   Severe Malnutrition related to chronic illness(bowel obstruction) as evidenced by energy intake < or equal to 75% for > or equal to 1 month, percent weight loss.  Ongoing  GOAL:   Patient will meet greater than or equal to 90% of their needs  Progressing   MONITOR:   I & O's  REASON FOR ASSESSMENT:   Consult Enteral/tube feeding initiation and management  ASSESSMENT:   Pt with PMH of HTN and GERD who was dx with duodenal mass/stricture now admitted for surgery now s/p resection of 3rd, 4th portion of duodenum, gastrojejunostomy, GJ tube placement.  Reviewed I/O's: -1.7 L x 24 hours and -24.6 L since 03/21/20  Emesis: 650 ml x 24 hours  Drain output: 2.8 L x 24 hours  Case discussed with RN, who verified TF order. Per RN, pt vomited this AM and has had large amount of output from g-tube. Per RN, pt has been drinking a lot, including V8 juice, ice cream, and a frappe from [employer].   Per MD notes, plan to continue clamping trials every 2 hours.   Case discussed with RNCM and [facility] Carolynn Sayers); education to be done with family and plan to discharge home with home health services Monday, 5/17.   Labs reviewed: K: 3.1, CBGS: 100-110.   Diet Order:   Diet Order            Diet clear liquid Room service appropriate? Yes; Fluid consistency: Thin  Diet effective now              EDUCATION NEEDS:   No education needs have been identified  at this time  Skin:  Skin Assessment: Skin Integrity Issues: Skin Integrity Issues:: Incisions Incisions: closed abdomen  Last BM:  04/04/20  Height:   Ht Readings from Last 1 Encounters:  03/20/20 6' 1.5" (1.867 m)    Weight:   Wt Readings from Last 1 Encounters:  04/04/20 87.9 kg    Ideal Body Weight:  86.3 kg  BMI:  Body mass index is 25.22 kg/m.  Estimated Nutritional Needs:   Kcal:  [id number]  Protein:  105-120 grams  Fluid:  > 2.2 L    Loistine Chance, RD, LDN, [place] Registered Dietitian II Certified Diabetes Care and Education Specialist Please refer to [facility] for RD and/or RD on-call/weekend/after hours pager

## 2020-04-04 NOTE — Progress Notes (Signed)
Physical Therapy Treatment & Discharge Patient Details Name: Albert Hartman MRN: [id number] DOB: 1946/10/30 Today's Date: 04/04/2020    History of Present Illness Pt is a 74 y.o. male admitted 03/20/20 with unintentional weight loss, decreased appetite and nausea/vomiting. Found to have duodenal obstruction suspecisious for cancer. S/p diagnostic laparoscopy, resection of 3rd/4th portion of duodenum, gastrojejunostomy, GJ tube, pyloroplasty 4/29. 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 S/P surgical exchange of GJ tube 04/01/20. PMH includes HTN, OSA, reflux.    PT Comments    Pt independent with bed mobility and transfers with RW. Pt supervision for >568f with RW and is comfortable with family walking with him. Discussed d/c from physical therapy acutely and pt agreed. Pt educated on importance of continuing to work on exercises and mobility while in the hospital and upon returning home, pt verbalized understanding and agreement. Pt is appropriate for d/c from physical therapy acutely from a functional mobility stand point.   Follow Up Recommendations  No PT follow up     Equipment Recommendations  None recommended by PT    Recommendations for Other Services       Precautions / Restrictions Precautions Precautions: Fall Precaution Comments: Multiple drains/tubes (gastrostomy,  LLQ drain), abdominal precautions for comfort Restrictions Weight Bearing Restrictions: No    Mobility  Bed Mobility                  Transfers Overall transfer level: Independent   Transfers: Sit to/from Stand Sit to Stand: Independent            Ambulation/Gait Ambulation/Gait assistance: Supervision Gait Distance (Feet): 583 Feet Assistive device: Rolling walker (2 wheeled);None Gait Pattern/deviations: Step-through pattern;Decreased stride length;Wide base of support     General Gait Details: Amb 249fno DME, amb 6230fUE assistance with counter,  500 ft with RW. All amb supervision.   Stairs             Wheelchair Mobility    Modified Rankin (Stroke Patients Only)       Balance     Sitting balance-Leahy Scale: Normal       Standing balance-Leahy Scale: Good Standing balance comment: Pt uses RW for comfort, Pt able to maintain balance and amb without RW.                            Cognition Arousal/Alertness: Awake/alert Behavior During Therapy: WFL for tasks assessed/performed Overall Cognitive Status: Within Functional Limits for tasks assessed                                        Exercises General Exercises - Lower Extremity Mini-Sqauts: AROM;Standing;Other reps (comment)(1x5, 1x6)    General Comments General comments (skin integrity, edema, etc.): Attempted to amb without RW today, pt able to amb without RW but reports he feels better with use of RW.      Pertinent Vitals/Pain Pain Assessment: Faces Faces Pain Scale: No hurt Pain Intervention(s): Limited activity within patient's tolerance    Home Living                      Prior Function            PT Goals (current goals can now be found in the care plan section) Acute Rehab PT Goals PT Goal Formulation: All assessment and  education complete, DC therapy Progress towards PT goals: Goals met/education completed, patient discharged from PT    Frequency    Min 3X/week      PT Plan Current plan remains appropriate    Co-evaluation              AM-PAC PT "6 Clicks" Mobility   Outcome Measure  Help needed turning from your back to your side while in a flat bed without using bedrails?: None Help needed moving from lying on your back to sitting on the side of a flat bed without using bedrails?: None Help needed moving to and from a bed to a chair (including a wheelchair)?: None Help needed standing up from a chair using your arms (e.g., wheelchair or bedside chair)?: None Help needed to  walk in hospital room?: None Help needed climbing 3-5 steps with a railing? : A Little 6 Click Score: 23    End of Session   Activity Tolerance: Patient tolerated treatment well Patient left: with family/visitor present;with call bell/phone within reach(in bathroom on commode) Nurse Communication: Mobility status PT Visit Diagnosis: Other abnormalities of gait and mobility (R26.89);Pain;Muscle weakness (generalized) (M62.81)     Time: 1287-8676 PT Time Calculation (min) (ACUTE ONLY): 14 min  Charges:  $Gait Training: 8-22 mins                     [employer] SPT 04/04/2020    Rolland Porter 04/04/2020, 1:51 PM

## 2020-04-05 LAB — GLUCOSE, CAPILLARY
Glucose-Capillary: 102 mg/dL — ABNORMAL HIGH (ref 70–99)
Glucose-Capillary: 105 mg/dL — ABNORMAL HIGH (ref 70–99)
Glucose-Capillary: 107 mg/dL — ABNORMAL HIGH (ref 70–99)
Glucose-Capillary: 118 mg/dL — ABNORMAL HIGH (ref 70–99)
Glucose-Capillary: 125 mg/dL — ABNORMAL HIGH (ref 70–99)
Glucose-Capillary: 131 mg/dL — ABNORMAL HIGH (ref 70–99)
Glucose-Capillary: 94 mg/dL (ref 70–99)

## 2020-04-05 NOTE — Progress Notes (Signed)
    16 Days Post-Op  Subjective: CC: Had 2 episode of emesis yesterday despite g tube being open to gravity. Vomited bile he states Family member at bs Hiccups at time  Objective: Vital signs in last 24 hours: Temp:  [97.4 F (36.3 C)-97.7 F (36.5 C)] 97.4 F (36.3 C) (05/15 0400) Pulse Rate:  [84-99] 84 (05/15 0400) Resp:  [14-16] 14 (05/15 0400) BP: (136-146)/(74-85) 146/79 (05/15 0400) SpO2:  [100 %] 100 % (05/15 0400) Weight:  [86.2 kg] 86.2 kg (05/15 0400) Last BM Date: 04/04/20  Intake/Output from previous day: 05/14 0701 - 05/15 0700 In: [id number] [P.O.:880; NG/GT:900] Out: 3750 [Emesis/NG output:200; Drains:3550] Intake/Output this shift: No intake/output data recorded.  PE: Gen: comfortable, no distress Pulm: normal rate and effort  Abd: soft, ND, NT, JP with serous/light milky outout-not enteric.  Right drain out. Extr: wwp, no edema   Lab Results:  Recent Labs    04/04/20 0716  WBC 12.5*  HGB 11.6*  HCT 37.1*  PLT 530*   BMET Recent Labs    04/04/20 0716  NA 145  K 3.1*  CL 113*  CO2 24  GLUCOSE 109*  BUN 25*  CREATININE 0.65  CALCIUM 9.2   PT/INR No results for input(s): LABPROT, INR in the last 72 hours. CMP     Component Value Date/Time   NA 145 04/04/2020 0716   K 3.1 (L) 04/04/2020 0716   CL 113 (H) 04/04/2020 0716   CO2 24 04/04/2020 0716   GLUCOSE 109 (H) 04/04/2020 0716   BUN 25 (H) 04/04/2020 0716   CREATININE 0.65 04/04/2020 0716   CALCIUM 9.2 04/04/2020 0716   PROT 4.6 (L) 03/25/2020 0621   ALBUMIN 2.4 (L) 03/25/2020 0621   AST 15 03/25/2020 0621   ALT 11 03/25/2020 0621   ALKPHOS 70 03/25/2020 0621   BILITOT 0.7 03/25/2020 0621   GFRNONAA >60 04/04/2020 0716   GFRAA >60 04/04/2020 0716   Lipase     Component Value Date/Time   LIPASE 15 03/12/2020 1104       Studies/Results: No results found.  Anti-infectives: Anti-infectives (From admission, onward)   Start     Dose/Rate Route Frequency Ordered Stop    03/20/20 1900  ciprofloxacin (CIPRO) IVPB 400 mg     400 mg 200 mL/hr over 60 Minutes Intravenous Every 12 hours 03/20/20 1829 03/20/20 2136   03/20/20 0600  ciprofloxacin (CIPRO) IVPB 400 mg     400 mg 200 mL/hr over 60 Minutes Intravenous On call to O.R. 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  Delayed gastric emptying vs gastroparesis - rediscussed with pt and wife Cycle tube feeds - no evidence of tube feeds in G tube bag - mainly only what he is taking by mouth Anticipate home Monday with home health.    FEN: TF's, CLD, IVF; check lytes in am given continued bilious vomiting/g tube drainage VTE: SCD's, Lovenox ID: Cipro periop   LOS: 16 days   Leighton Ruff. Redmond Pulling, MD, FACS General, Bariatric, & Minimally Invasive Surgery [facility], [place]

## 2020-04-05 NOTE — Progress Notes (Signed)
Patient requires no assistance with home cpap at bedside.

## 2020-04-05 NOTE — Plan of Care (Signed)
  Problem: Clinical Measurements: Goal: Diagnostic test results will improve Outcome: Progressing Goal: Respiratory complications will improve Outcome: Progressing Goal: Cardiovascular complication will be avoided Outcome: Progressing   Problem: Activity: Goal: Risk for activity intolerance will decrease Outcome: Progressing   Problem: Coping: Goal: Level of anxiety will decrease Outcome: Progressing   Problem: Elimination: Goal: Will not experience complications related to bowel motility Outcome: Progressing Goal: Will not experience complications related to urinary retention Outcome: Progressing   Problem: Pain Managment: Goal: General experience of comfort will improve Outcome: Progressing   Problem: Safety: Goal: Ability to remain free from injury will improve Outcome: Progressing   Problem: Skin Integrity: Goal: Risk for impaired skin integrity will decrease Outcome: Progressing   Problem: Education: Goal: Will demonstrate proper wound care and an understanding of methods to prevent future damage Outcome: Progressing

## 2020-04-06 LAB — BASIC METABOLIC PANEL
Anion gap: 11 (ref 5–15)
BUN: 29 mg/dL — ABNORMAL HIGH (ref 8–23)
CO2: 24 mmol/L (ref 22–32)
Calcium: 9.3 mg/dL (ref 8.9–10.3)
Chloride: 108 mmol/L (ref 98–111)
Creatinine, Ser: 0.72 mg/dL (ref 0.61–1.24)
GFR calc Af Amer: >60 mL/min (ref >60)
GFR calc non Af Amer: >60 mL/min (ref >60)
Glucose, Bld: 149 mg/dL — ABNORMAL HIGH (ref 70–99)
Potassium: 3.6 mmol/L (ref 3.5–5.1)
Sodium: 143 mmol/L (ref 135–145)

## 2020-04-06 LAB — GLUCOSE, CAPILLARY
Glucose-Capillary: 110 mg/dL — ABNORMAL HIGH (ref 70–99)
Glucose-Capillary: 112 mg/dL — ABNORMAL HIGH (ref 70–99)
Glucose-Capillary: 120 mg/dL — ABNORMAL HIGH (ref 70–99)
Glucose-Capillary: 122 mg/dL — ABNORMAL HIGH (ref 70–99)
Glucose-Capillary: 122 mg/dL — ABNORMAL HIGH (ref 70–99)
Glucose-Capillary: 130 mg/dL — ABNORMAL HIGH (ref 70–99)

## 2020-04-06 LAB — MAGNESIUM: Magnesium: 2.1 mg/dL (ref 1.7–2.4)

## 2020-04-06 MED ORDER — OXYCODONE HCL 5 MG/5ML PO SOLN
5.0000 mg | Freq: Four times a day (QID) | ORAL | Status: DC | PRN
  Filled 2020-04-06: qty 5

## 2020-04-06 MED ORDER — PROMETHAZINE HCL 25 MG/ML IJ SOLN: 12.5000 mg | Freq: Four times a day (QID) | INTRAMUSCULAR | Status: DC | PRN

## 2020-04-06 MED ORDER — ONDANSETRON HCL 4 MG/2ML IJ SOLN
4.0000 mg | Freq: Four times a day (QID) | INTRAMUSCULAR | Status: DC
  Filled 2020-04-06: qty 2

## 2020-04-06 MED ORDER — ONDANSETRON HCL 4 MG/2ML IJ SOLN
4.0000 mg | Freq: Four times a day (QID) | INTRAMUSCULAR | Status: DC | PRN
  Administered 2020-04-06: 4 mg via INTRAVENOUS

## 2020-04-06 MED ORDER — SODIUM CHLORIDE 0.9 % IV SOLN
250.0000 mg | Freq: Three times a day (TID) | INTRAVENOUS | Status: AC
  Administered 2020-04-06 – 2020-04-07 (×3): 250 mg via INTRAVENOUS
  Filled 2020-04-06 (×3): qty 5

## 2020-04-06 MED ORDER — SODIUM CHLORIDE 0.9 % IV SOLN
5.0000 mg | Freq: Four times a day (QID) | INTRAVENOUS | Status: DC | PRN
  Administered 2020-04-06 – 2020-04-07 (×2): 5 mg via INTRAVENOUS
  Filled 2020-04-06 (×5): qty 0.2

## 2020-04-06 MED ORDER — GABAPENTIN 250 MG/5ML PO SOLN
100.0000 mg | Freq: Three times a day (TID) | ORAL | Status: DC
  Administered 2020-04-06 – 2020-04-07 (×5): 100 mg
  Filled 2020-04-06 (×6): qty 2

## 2020-04-06 NOTE — Progress Notes (Signed)
Patient has home CPAP machine set up at bedside. Patient states he is unable to wear it at this time due to vomiting. Patient aware to call for assistance if needed.

## 2020-04-06 NOTE — Plan of Care (Signed)
  Problem: Education: Goal: Knowledge of General Education information will improve Description: Including pain rating scale, medication(s)/side effects and non-pharmacologic comfort measures Outcome: Progressing   Problem: Health Behavior/Discharge Planning: Goal: Ability to manage health-related needs will improve Outcome: Progressing   Problem: Clinical Measurements: Goal: Ability to maintain clinical measurements within normal limits will improve Outcome: Progressing Goal: Will remain free from infection Outcome: Progressing Goal: Diagnostic test results will improve Outcome: Progressing Goal: Respiratory complications will improve Outcome: Progressing Goal: Cardiovascular complication will be avoided Outcome: Progressing   Problem: Activity: Goal: Risk for activity intolerance will decrease Outcome: Progressing   Problem: Nutrition: Goal: Adequate nutrition will be maintained Outcome: Progressing   Problem: Coping: Goal: Level of anxiety will decrease Outcome: Progressing   Problem: Elimination: Goal: Will not experience complications related to bowel motility Outcome: Progressing Goal: Will not experience complications related to urinary retention Outcome: Progressing   Problem: Pain Managment: Goal: General experience of comfort will improve Outcome: Progressing   Problem: Safety: Goal: Ability to remain free from injury will improve Outcome: Progressing   Problem: Skin Integrity: Goal: Risk for impaired skin integrity will decrease Outcome: Progressing   Problem: Education: Goal: Will demonstrate proper wound care and an understanding of methods to prevent future damage Outcome: Progressing   Problem: Bowel/Gastric: Goal: Gastrointestinal status for postoperative course will improve Outcome: Progressing   Problem: Nutritional: Goal: Will attain and maintain optimal nutritional status will improve Outcome: Progressing   Problem: Clinical  Measurements: Goal: Postoperative complications will be avoided or minimized Outcome: Progressing   Problem: Respiratory: Goal: Will regain and/or maintain adequate ventilation Outcome: Progressing   Problem: Skin Integrity: Goal: Wound healing without signs and symptoms of infection will improve Outcome: Progressing Goal: Risk for impaired skin integrity will decrease Outcome: Progressing   Problem: Tissue Perfusion: Goal: Risk of venous thrombosis will decrease Outcome: Progressing   Problem: Urinary Elimination: Goal: Ability to achieve and maintain adequate urine output will improve Outcome: Progressing

## 2020-04-06 NOTE — Progress Notes (Signed)
17 Days Post-Op  Subjective: CC: Had 2 episode of emesis yesterday  Vomited bile he states Biggest c/o is Hiccups which is preventing from sleeping  Objective: Vital signs in last 24 hours: Temp:  [97.5 F (36.4 C)-98.3 F (36.8 C)] 98.3 F (36.8 C) (05/16 0350) Pulse Rate:  [89-106] 106 (05/16 0350) Resp:  [17-18] 17 (05/16 0350) BP: (143-153)/(78-90) 153/83 (05/16 0350) SpO2:  [94 %-99 %] 99 % (05/16 0350) Weight:  [86.5 kg] 86.5 kg (05/16 0313) Last BM Date: 04/05/20  Intake/Output from previous day: 05/15 0701 - 05/16 0700 In: 2503.8 [P.O.:720; NG/GT:1783.8] Out: 1650 [Urine:200; Emesis/NG output:700; Drains:750] Intake/Output this shift: No intake/output data recorded.  PE: Gen: comfortable, no distress Pulm: normal rate and effort  Abd: soft, ND, NT, JP with serous/light milky outout-not enteric.  Right drain out. Extr: wwp, no edema   Lab Results:  Recent Labs    04/04/20 0716  WBC 12.5*  HGB 11.6*  HCT 37.1*  PLT 530*   BMET Recent Labs    04/04/20 0716 04/06/20 0356  NA 145 143  K 3.1* 3.6  CL 113* 108  CO2 24 24  GLUCOSE 109* 149*  BUN 25* 29*  CREATININE 0.65 0.72  CALCIUM 9.2 9.3   PT/INR No results for input(s): LABPROT, INR in the last 72 hours. CMP     Component Value Date/Time   NA 143 04/06/2020 0356   K 3.6 04/06/2020 0356   CL 108 04/06/2020 0356   CO2 24 04/06/2020 0356   GLUCOSE 149 (H) 04/06/2020 0356   BUN 29 (H) 04/06/2020 0356   CREATININE 0.72 04/06/2020 0356   CALCIUM 9.3 04/06/2020 0356   PROT 4.6 (L) 03/25/2020 0621   ALBUMIN 2.4 (L) 03/25/2020 0621   AST 15 03/25/2020 0621   ALT 11 03/25/2020 0621   ALKPHOS 70 03/25/2020 0621   BILITOT 0.7 03/25/2020 0621   GFRNONAA >60 04/06/2020 0356   GFRAA >60 04/06/2020 0356   Lipase     Component Value Date/Time   LIPASE 15 03/12/2020 1104       Studies/Results: No results found.  Anti-infectives: Anti-infectives (From admission, onward)   Start      Dose/Rate Route Frequency Ordered Stop   04/06/20 0930  erythromycin 250 mg in sodium chloride 0.9 % 100 mL IVPB     250 mg 100 mL/hr over 60 Minutes Intravenous Every 8 hours 04/06/20 0924 04/07/20 0559   03/20/20 1900  ciprofloxacin (CIPRO) IVPB 400 mg     400 mg 200 mL/hr over 60 Minutes Intravenous Every 12 hours 03/20/20 1829 03/20/20 2136   03/20/20 0600  ciprofloxacin (CIPRO) IVPB 400 mg     400 mg 200 mL/hr over 60 Minutes Intravenous On call to O.R. 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  Delayed gastric emptying vs gastroparesis - rediscussed with pt - will start IV erythromycin   Cycle tube feeds - no evidence of tube feeds in G tube bag - mainly only what he is taking by mouth; tolerating J tube feeds Anticipate home Monday with home health.    Hiccups - on PPI, will add gabapentin and see if helps.     FEN: TF's, CLD, IVF;  VTE: SCD's, Lovenox ID: Cipro periop  LOS: 76 days   Leighton Ruff. Redmond Pulling, MD, FACS General, Bariatric, & Minimally Invasive Surgery [facility], [place]

## 2020-04-06 NOTE — Progress Notes (Signed)
Pt has had several episodes of vomiting with frequent hiccups  and nausea. Has not slept the past 2 night shifts.  Dr. Bobbye Morton paged this shift received order for Zofran. Zofran and Compazine has helped very little with nausea... Will continiue to monitor.

## 2020-04-07 ENCOUNTER — Inpatient Hospital Stay (HOSPITAL_COMMUNITY): Payer: Medicare Other

## 2020-04-07 LAB — GLUCOSE, CAPILLARY
Glucose-Capillary: 129 mg/dL — ABNORMAL HIGH (ref 70–99)
Glucose-Capillary: 119 mg/dL — ABNORMAL HIGH (ref 70–99)
Glucose-Capillary: 124 mg/dL — ABNORMAL HIGH (ref 70–99)

## 2020-04-07 IMAGING — RF DG UGI W SINGLE CM
11 of 19 series · 12 of 24 positions shown · IV contrast (omnipaque)
Comparison: Radiographs [DATE].

CLINICAL DATA: Post resection of the 3rd and 4th portions of the
duodenum, pyloroplasty and gastrojejunostomy placement [DATE].
Gastrojejunostomy tube was replaced [DATE]. Assess patency of
the gastrojejunostomy.

EXAM:
WATER SOLUBLE UPPER GI SERIES
TECHNIQUE: Single-column upper GI series was performed using water soluble
contrast.
CONTRAST:  Omnipaque 300, 200 cc totally via mouth, G-tube and GJ
tube.

[Series 2: cp_standard · 0.59mm/px · 1 of 10 frames shown (1 of 10)]
[frame 2/10]
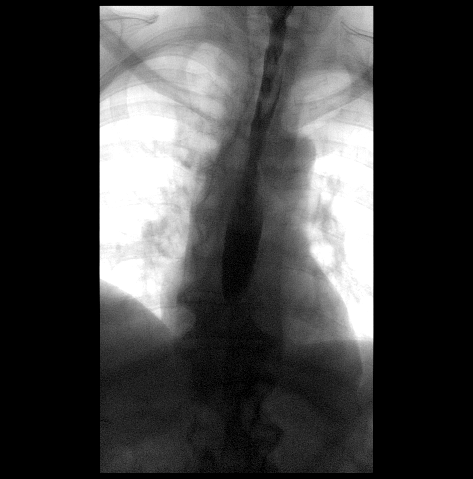

[Series 3: cp_standard · 0.59mm/px · 2 of 10 frames shown (2 of 10)]
[frame 2/10]
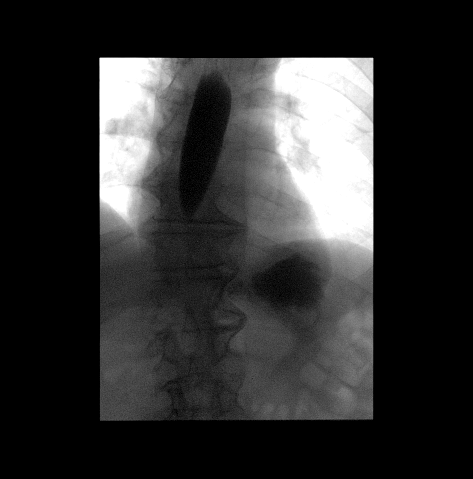
[frame 9/10]
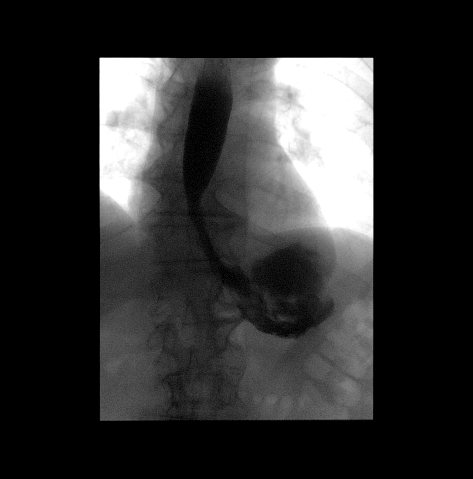

[Series 5: fluoro_barium singleshot_bw · 0.20mm/px · 1 of 1 slices shown]
[im 1/1]
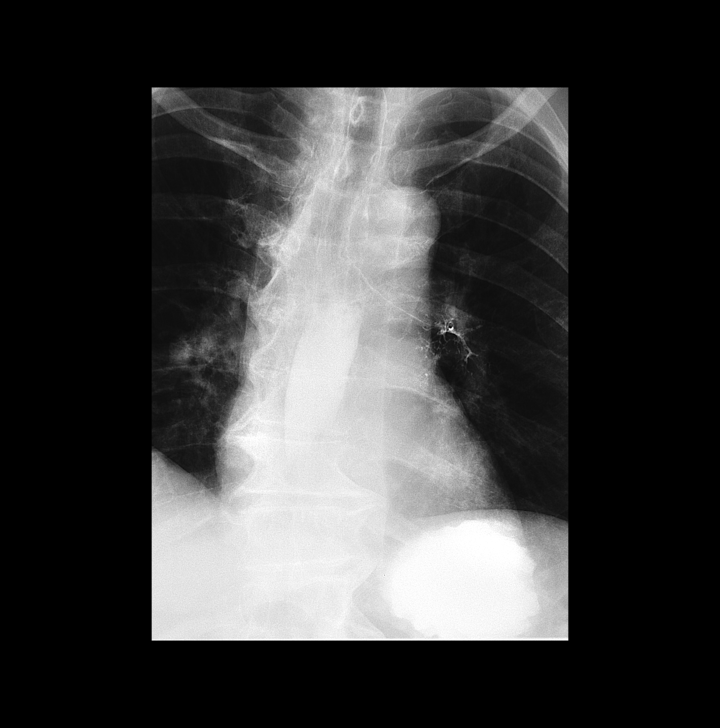

[Series 6: cp_standard · 0.58mm/px · 1 of 42 frames shown (3 of 10)]
[frame 22/42]
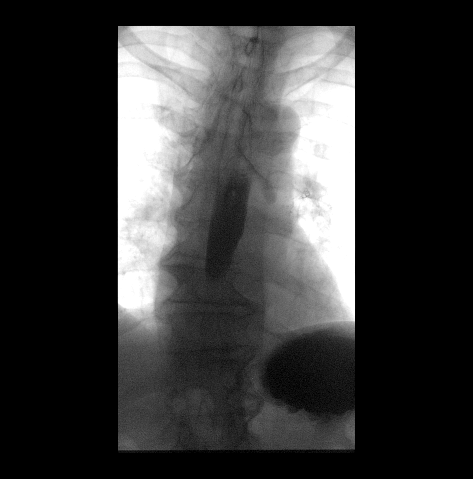

[Series 8: cp_standard · 0.28mm/px · 1 of 1 slices shown (4 of 10)]
[im 1/1]
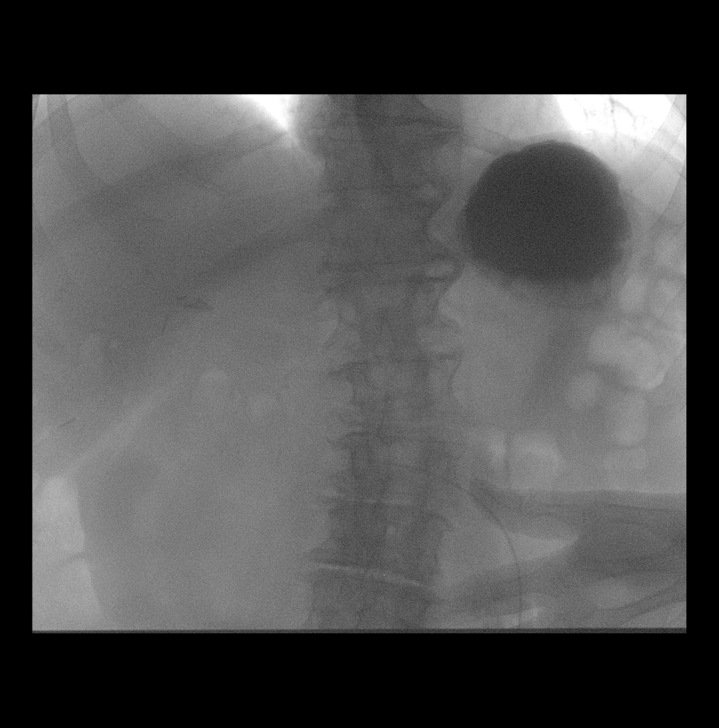

[Series 11: cp_standard · 0.30mm/px · 1 of 1 slices shown (5 of 10)]
[im 1/1]
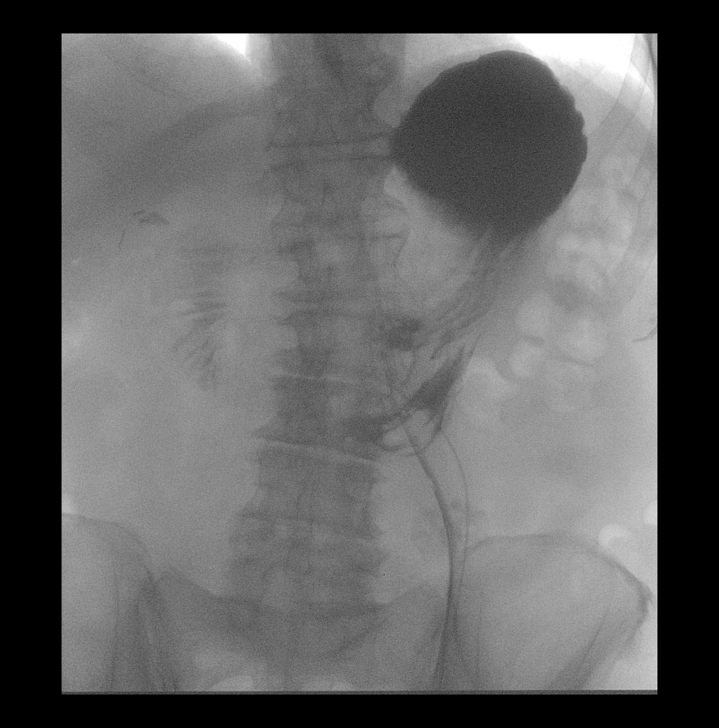

[Series 13: cp_standard · 0.27mm/px · 1 of 1 slices shown (6 of 10)]
[im 1/1]
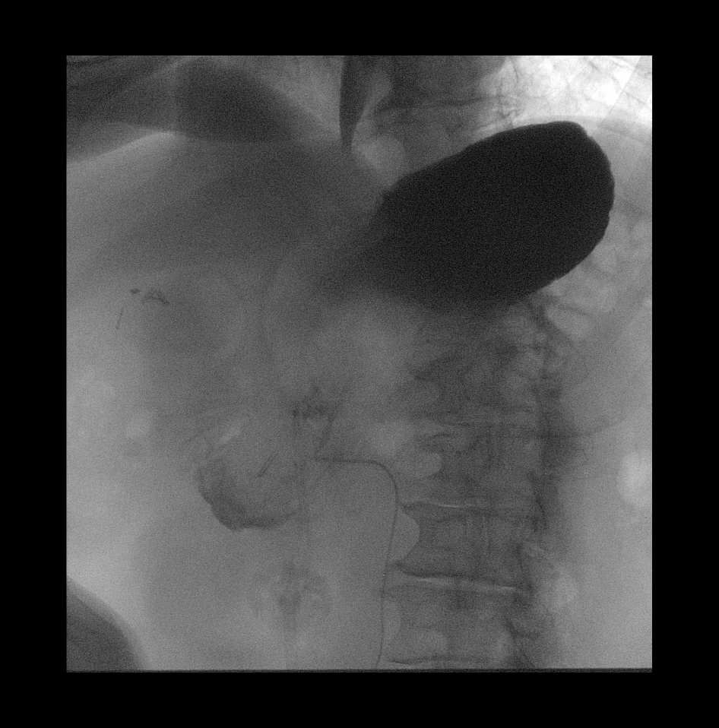

[Series 16: cp_standard · 0.27mm/px · 1 of 1 slices shown (7 of 10)]
[im 1/1]
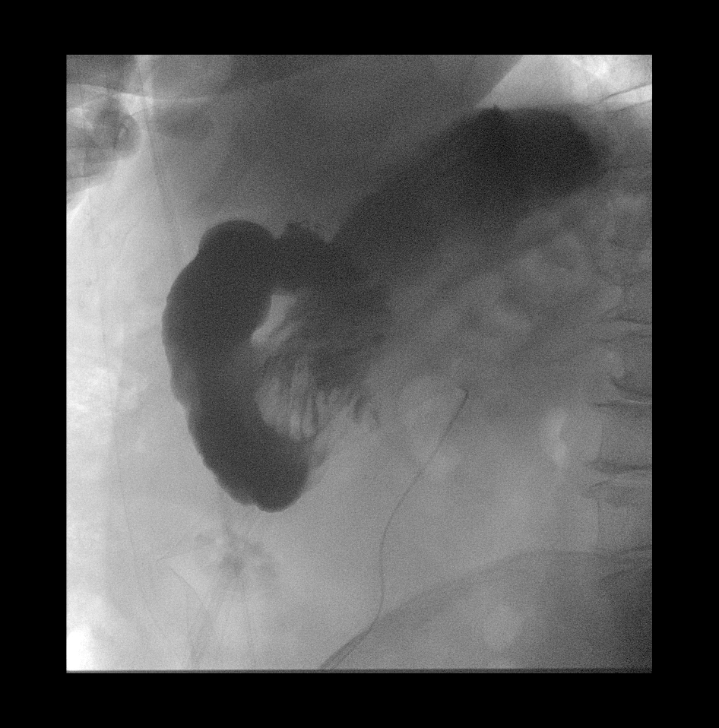

[Series 19: cp_standard · 0.27mm/px · 1 of 1 slices shown (8 of 10)]
[im 1/1]
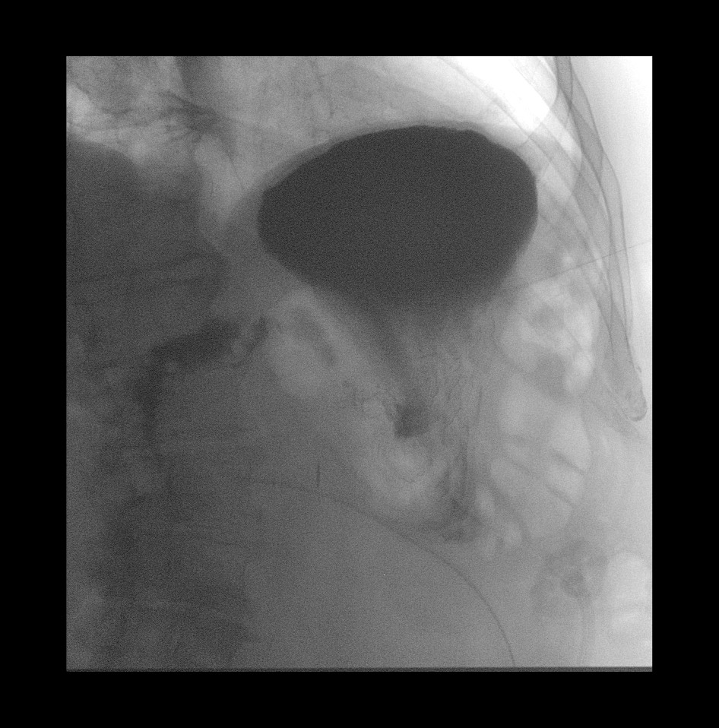

[Series 21: cp_standard · 0.28mm/px · 1 of 1 slices shown (9 of 10)]
[im 1/1]
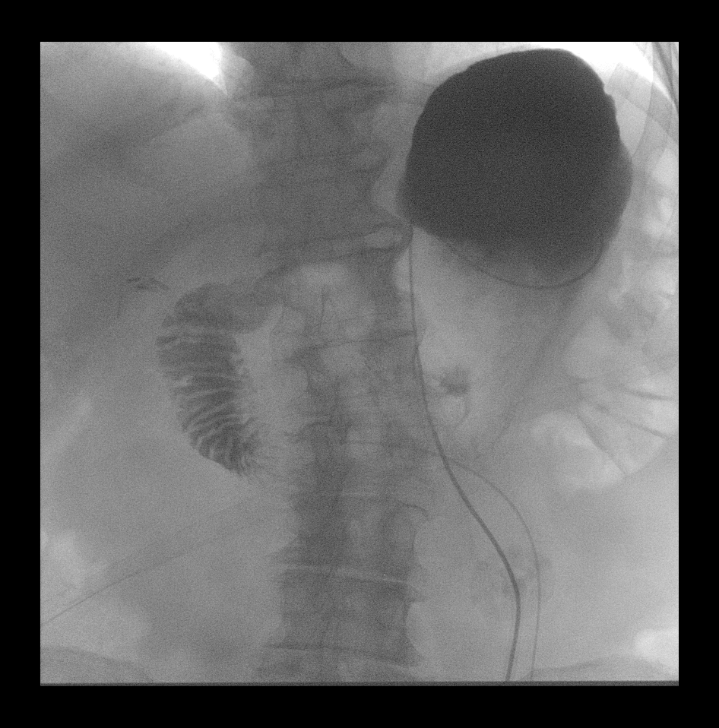

[Series 24: cp_standard · 0.27mm/px · 1 of 1 slices shown (10 of 10)]
[im 1/1]
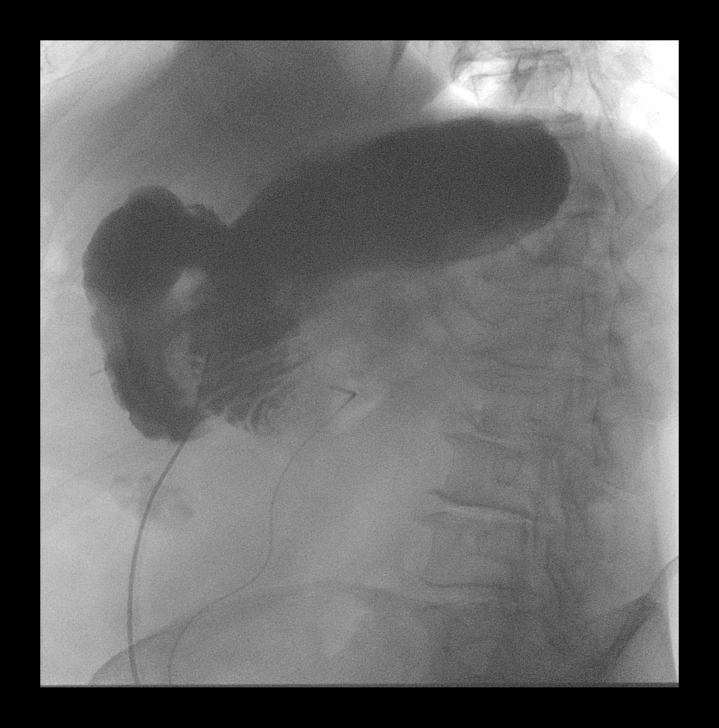

[12 of 24 positions shown; findings below may reference images not displayed]

Images during GJ tube
replacement [DATE].

FLUOROSCOPY TIME:  Fluoroscopy Time: 4 minutes and 0 seconds of
low-dose pulsed fluoroscopy.

Radiation Exposure Index (if provided by the fluoroscopic device):
48.2 mGy

Number of Acquired Spot Images: 1 scout image. Five spot
fluoroscopic images.
FINDINGS: The scout abdominal radiograph demonstrates a surgical drain. The
percutaneous gastrojejunostomy tubes are not well visualized,
although the jejunal portion appears looped in the gastric fundus.
There is no free intraperitoneal air. Degenerative changes are
present within the spine associated with a convex left scoliosis.

Initially, the patient drank Omnipaque 300 from a straw. The
esophagus appears grossly normal. After drinking the contrast, the
patient began coughing, and a small amount of aspiration into the
left mainstem bronchus was noted. The patient reports that has been
drinking fluids over the last 2 days and denies significant
coughing. A 2nd attempt at administering oral contrast by mouth
resulted in additional aspiration. No additional contrast was given
by mouth.

We next injected contrast via the percutaneous G-tube, and this
preferentially filled the gastric fundus with the patient supine and
semi erect. Given the patient's recent surgery, there is limited
mobility. However, we placed the patient into the right and left
lateral decubitus positions in an attempt to maneuver the contrast
into the gastrojejunostomy. No definite opacification of the
gastrojejunostomy was achieved. There was opacification of the
native proximal duodenum. No contrast leak identified.

We next injected contrast via the jejunal portion of the tube in an
attempt to opacify the gastrojejunostomy retrograde. However, on
injection of the jejunal port, the tip appears displaced into the
gastric fundus. No filling of the gastrojejunostomy identified.
IMPRESSION: 1. Attempted upper GI series performed by mouth resulted in
recurrent aspiration of water-soluble contrast into the
tracheobronchial tree (with cough response). Speech therapy
evaluation recommended.
2. The remainder of the study was performed via the gastric and
jejunal portions of the GJ tube.
3. The gastric portion of the tube is positioned within the gastric
lumen. The jejunal portion of the tube has flipped back into the
gastric fundus.
4. No definite opacification of the gastrojejunostomy is identified.
5. No evidence of contrast leak.
6. Findings were discussed with Dr. CORO.

## 2020-04-07 MED ORDER — OSMOLITE 1.5 CAL PO LIQD: 1500.0000 mL | ORAL | 0 refills | Status: AC

## 2020-04-07 MED ORDER — IOHEXOL 300 MG/ML  SOLN
150.0000 mL | Freq: Once | INTRAMUSCULAR | Status: AC | PRN
  Administered 2020-04-07: 200 mL via ORAL

## 2020-04-07 MED ORDER — OXYCODONE HCL 5 MG/5ML PO SOLN: 5.0000 mg | Freq: Four times a day (QID) | ORAL | 0 refills | Status: AC | PRN

## 2020-04-07 MED ORDER — CHLORPROMAZINE HCL 10 MG PO TABS
10.0000 mg | ORAL_TABLET | Freq: Three times a day (TID) | ORAL | Status: DC | PRN
  Filled 2020-04-07: qty 1

## 2020-04-07 MED ORDER — CHLORPROMAZINE HCL 10 MG PO TABS: 10.0000 mg | ORAL_TABLET | Freq: Three times a day (TID) | ORAL | 0 refills | Status: AC | PRN

## 2020-04-07 MED ORDER — PROCHLORPERAZINE MALEATE 10 MG PO TABS: 10.0000 mg | ORAL_TABLET | Freq: Four times a day (QID) | ORAL | 0 refills | Status: AC | PRN

## 2020-04-07 NOTE — Discharge Instructions (Signed)
[facility], [place] [phone]  ABDOMINAL SURGERY: POST OP INSTRUCTIONS  Always review your discharge instruction sheet given to you by the facility where your surgery was performed.  IF YOU HAVE DISABILITY OR FAMILY LEAVE FORMS, YOU MUST BRING THEM TO THE OFFICE FOR PROCESSING.  PLEASE DO NOT GIVE THEM TO YOUR DOCTOR.  1. A prescription for pain medication may be given to you upon discharge.  Take your pain medication as prescribed, if needed.  If narcotic pain medicine is not needed, then you may take acetaminophen (Tylenol) or ibuprofen (Advil) as needed. 2. Take your usually prescribed medications unless otherwise directed. 3. If you need a refill on your pain medication, please contact your pharmacy. They will contact our office to request authorization.  Prescriptions will not be filled after 5pm or on week-ends. 4. You should follow a light diet the first few days after arrival home, such as soup and crackers, pudding, etc.unless your doctor has advised otherwise. A high-fiber, low fat diet can be resumed as tolerated.   Be sure to include lots of fluids daily. Most patients will experience some swelling and bruising on the chest and neck area.  Ice packs will help.  Swelling and bruising can take several days to resolve 5. Most patients will experience some swelling and bruising in the area of the incision. Ice pack will help. Swelling and bruising can take several days to resolve..  6. It is common to experience some constipation if taking pain medication after surgery.  Increasing fluid intake and taking a stool softener will usually help or prevent this problem from occurring.  A mild laxative (Milk of Magnesia or Miralax) should be taken according to package directions if there are no bowel movements after 48 hours. 7.  You may have steri-strips (small skin tapes) in place directly over the incision.  These strips should be left on the skin for 10-14 days.  If your  surgeon used skin glue on the incision, you may shower in 48 hours.  The glue will flake off over the next 2-3 weeks.  Any sutures or staples will be removed at the office during your follow-up visit. You may find that a light gauze bandage over your incision may keep your staples from being rubbed or pulled. You may shower and replace the bandage daily. 8. ACTIVITIES:  You may resume regular (light) daily activities beginning the next day--such as daily self-care, walking, climbing stairs--gradually increasing activities as tolerated.  You may have sexual intercourse when it is comfortable.  Refrain from any heavy lifting or straining until approved by your doctor. a. You may drive when you no longer are taking prescription pain medication, you can comfortably wear a seatbelt, and you can safely maneuver your car and apply brakes b. Return to Work: __________8 weeks if applicable_________________________ 44. You should see your doctor in the office for a follow-up appointment approximately two weeks after your surgery.  Make sure that you call for this appointment within a day or two after you arrive home to insure a convenient appointment time. OTHER INSTRUCTIONS:  _____________________________________________________________ _____________________________________________________________  WHEN TO CALL YOUR DOCTOR: 1. Fever over 101.0 2. Inability to urinate 3. Nausea and/or vomiting 4. Extreme swelling or bruising 5. Continued bleeding from incision. 6. Increased pain, redness, or drainage from the incision. 7. Difficulty swallowing or breathing 8. Muscle cramping or spasms. 9. Numbness or tingling in hands or feet or around lips.  The clinic staff is  available to answer your questions during regular business hours.  Please don't hesitate to call and ask to speak to one of the nurses if you have concerns.  For further questions, please visit [email or web address]      How to Give  a Feeding Through a Jejunostomy Feeding Tube A feeding tube is a soft, flexible tube through which medicine, water, and liquid food (formulaor breastmilk) can be given. A person may have a feeding tube if he or she has trouble swallowing or cannot have food or medicine by mouth. A health care provider may also give you more specific instructions. If you have problems or questions, contact a health care provider. Supplies needed:  Prescribed formula.  Feeding bag set.  Feeding tube pump.  Pole to hang feeding.  20-60 mL syringe to check tube placement.  A syringe to flush the feeding tube.  Sterile or purified water. Follow these guidelines: ? Use sterile water if the person has a weak immune system and has difficulty fighting off infections (is immunocompromised), or if you are unsure about the amount of chemical contaminants in purified or drinking water. ? Purify drinking water by boiling it for at least 1 minute. Keep a lid over the water while it boils. Allow the water to cool to room temperature before using it.   How to give a feeding through a feeding tube pump  1. Have all supplies ready and available. 2. Wash your hands with soap and water. 3. Check the placement of the feeding tube as directed. 4. Raise the head of the person 30-45, or as directed. 5. Pour the prescribed amount of formula into the feeding bag set. 6. Hang the feeding bag set from a pole. Formula that is prepared in a sterile way can hang for up to 8 hours. For a newborn, hang time should be limited to 4 hours. 7. Prime the entire feeding bag set with the formula. To do this, allow the liquid to travel through the entire set to the end of the tubing. 8. Cap the feeding bag set until after the feeding tube has been flushed. 9. Load the feeding bag set into the feeding tube pump. 10. Clamp or kink the feeding tube before removing the cap and as you are disconnecting syringes and feeding tubing. 11. Uncap the  end of the feeding tube. 12. Use a syringe to flush the feeding tube with purified or sterile water as directed. 13. Uncap the feeding bag set. 14. Connect the feeding bag set to the feeding tube. 15. Set the prescribed feeding rate on the feeding tube pump. 16. Start the feeding tube pump.   Contact a health care provider if:  You are having trouble doing a tube feeding.  You are not sure what type of formula to give.  The feeding tube is clogged, falls out, or does not work.  The person who is getting the feeding has unusual weight loss or weight gain.   Get help right away if:  The skin area around the feeding tube is red, swollen, warm to the touch, or tender.  There is drainage coming from the area around the feeding tube.  The drainage coming from the area around the feeding tube has a bad smell.  The person who is getting the feeding develops any of these problems: ? Vomiting or nausea. ? Fever. ? Constipation or diarrhea. ? A large, bloated stomach.   Summary  A person may have a feeding tube if he or   she has trouble swallowing or cannot have food or medicine by mouth.  You can give formula or breast milk through the tube.  Have all of your supplies ready and available before giving a feeding.  If you have problems or questions, contact a health care provider. This information is not intended to replace advice given to you by your health care provider. Make sure you discuss any questions you have with your health care provider.   

## 2020-04-07 NOTE — Discharge Summary (Signed)
Physician Discharge Summary  Patient ID: Albert Hartman MRN: [id number] DOB/AGE: 26-Jun-1946 74 y.o.  Admit date: 03/20/2020 Discharge date: 04/09/2020  Admission Diagnoses: Moderate protein calorie malnutrition.   Duodenal obstruction Abdominal lymphadenopathy   Discharge Diagnoses:  Active Problems:   Duodenal mass   Duodenal cancer (HCC)   Protein-calorie malnutrition, severe   Discharged Condition: stable  Hospital Course:  Pt was admitted to the ICU following resection of 3-4th portions of duodenum, pyloroplastic, GJ and GJ tube 03/20/2020.  He had an epidural and PCA for pain control. He had hyponatremia and hypophosphatemia POD 1.  The IV fluids were changed and his phos was repleted.  He had reasonable pain control.  Tube feeds were started via the J tube on POD 3 at trophic levels.  His G tube was kept to gravity.  Epidural was placed pre op for pain control.  This was pulled on POD 5.  G tube clamping trials were started once he had flatus, but he had vomiting.  He was transferred to the floor after the epidural in place.  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  Tube feeds were increased to goal.  His clamping tube trials were attempted, but he continued to have issues.  He was allowed to drink liquids for palliation.  His J tube port clogged and required replacement.  This took a few days to arrange and get back up to goal feeds.  G tube output was quite high.  J tube feeds were cycled.  He was allowed some sparing full liquids for comfort.  He was discharged to home in good spirits, but with G tube clamped 2 hours and unclamped 2 hours with open to drain at night.  He did not appear to have passage of contrast into his jejunum on UGI.  He is, however, having brown/green bowel movements, indicating that bile is passing through.  Drains were removed prior to d/c.    Consults: IR  Significant Diagnostic Studies: labs: Cr 0.49 prior to d/c.  WBCs 12.5, HCT 37.1 priro to d/c.     Treatments: surgery: see above  Discharge Exam: Blood pressure (!) 137/97, pulse (!) 107, temperature 97.7 F (36.5 C), temperature source Oral, resp. rate 18, height 6' 1.5" (1.867 m), weight 85.2 kg, SpO2 100 %. General appearance: alert, cooperative and no distress Resp: breathing comfortably Cardio: regular rate and rhythm GI: soft, non distended, approp tender, g tube output looks like the coffee he drunk.  Left drain a bit murky, but scant output x 2 days. Extremities: extremities normal, atraumatic, no cyanosis or edema  Disposition: Discharge disposition: 01-Home or Self Care       Discharge Instructions    Call MD for:  difficulty breathing, headache or visual disturbances   Complete by: As directed    Call MD for:  hives   Complete by: As directed    Call MD for:  persistant nausea and vomiting   Complete by: As directed    Call MD for:  severe uncontrolled pain   Complete by: As directed    Call MD for:  temperature >100.4   Complete by: As directed    Change dressing (specify)   Complete by: As directed    Drain dressing and GJ tube dressing as needed.  Measure and record drain output daily.   Diet - low sodium heart healthy   Complete by: As directed    Increase activity slowly   Complete by: As directed      Allergies  as of 04/07/2020      Reactions   Penicillins Other (See Comments)   Did it involve swelling of the face/tongue/throat, SOB, or low BP? Unknown Did it involve sudden or severe rash/hives, skin peeling, or any reaction on the inside of your mouth or nose? Unknown Did you need to seek medical attention at a hospital or doctor's office? Unknown When did it last happen?childhood reaction. If all above answers are "NO", may proceed with cephalosporin use.   Sulfa Antibiotics Other (See Comments)   Childhood reaction.      Medication List    TAKE these medications   allopurinol 300 MG tablet Commonly known as: ZYLOPRIM Take 300 mg  by mouth daily. Notes to patient: Resume to your regular schedule   chlorproMAZINE 10 MG tablet Commonly known as: THORAZINE Take 1 tablet (10 mg total) by mouth 3 (three) times daily as needed for hiccoughs.   feeding supplement (OSMOLITE 1.5 CAL) Liqd Place 1,500 mLs into feeding tube continuous.   fluocinonide 0.05 % external solution Commonly known as: LIDEX Apply 1 application topically 2 (two) times a week. Applied to scalp twice weekly after washing hair. Notes to patient: Resume to your regular schedule   hydrocortisone 2.5 % cream Apply 1 application topically 2 (two) times a week. Sundays & Saturdays. Notes to patient: Resume to your regular schedule   ketoconazole 2 % cream Commonly known as: NIZORAL Apply 1 application topically every Monday, Tuesday, Wednesday, Thursday, and Friday. Applied to face Notes to patient: Resume to your regular schedule   lisinopril 10 MG tablet Commonly known as: ZESTRIL Take 10 mg by mouth at bedtime. Notes to patient: Resume to your regular schedule   Lubricant Eye Drops 0.4-0.3 % Soln Generic drug: Polyethyl Glycol-Propyl Glycol Place 1 drop into both eyes in the morning and at bedtime. Notes to patient: Resume to your regular schedule   multivitamin with minerals Tabs tablet Take 1 tablet by mouth daily. Centrum Silver for Men 50+ Notes to patient: Resume to your regular schedule   nystatin cream Commonly known as: MYCOSTATIN Apply 1 application topically 2 (two) times daily. Applied to groin rash Notes to patient: Resume to your regular schedule   omeprazole 40 MG capsule Commonly known as: PRILOSEC Take 40 mg by mouth in the morning and at bedtime. In the morning & 1630 Notes to patient: Resume to your regular schedule   oxyCODONE 5 MG/5ML solution Commonly known as: ROXICODONE Place 5 mLs (5 mg total) into feeding tube every 6 (six) hours as needed for moderate pain.   prochlorperazine 10 MG tablet Commonly known  as: COMPAZINE Take 1 tablet (10 mg total) by mouth every 6 (six) hours as needed for nausea or vomiting (Use for nausea and / or vomiting unresolved with ondansetron (Zofran).).            Discharge Care Instructions  (From admission, onward)         Start     Ordered   04/07/20 0000  Change dressing (specify)    Comments: Drain dressing and GJ tube dressing as needed.  Measure and record drain output daily.   04/07/20 1018         Follow-up Information    Stark Klein, MD In 2 weeks.   Specialty: General Surgery Why: Friday, Apr 18, 2020 @ 945am Contact information: [address] [place] [address] [phone]           Signed: Stark Klein 04/09/2020, 2:28  PM

## 2020-04-07 NOTE — Progress Notes (Signed)
Nutrition Follow-up  RD working remotely.  DOCUMENTATION CODES:   Severe malnutrition in context of chronic illness  INTERVENTION:   -Continue with nocturnal tube feedings (recommendations for home):  Osmolite 1.5 @ 60m/hr via j-tube over 18 hour period (ex 1500-0900)  Continue with 30 ml free water flush every 4 hours for tube patency  Tube feeding regimen provides2160kcal (100% of needs),90grams of protein, and 1099mof H2O. Total free water: 1217 ml  NUTRITION DIAGNOSIS:   Severe Malnutrition related to chronic illness(bowel obstruction) as evidenced by energy intake < or equal to 75% for > or equal to 1 month, percent weight loss.  Ongoing  GOAL:   Patient will meet greater than or equal to 90% of their needs  Met with TF  MONITOR:   I & O's  REASON FOR ASSESSMENT:   Consult Enteral/tube feeding initiation and management  ASSESSMENT:   Pt with PMH of HTN and GERD who was dx with duodenal mass/stricture now admitted for surgery now s/p resection of 3rd, 4th portion of duodenum, gastrojejunostomy, GJ tube placement.  Reviewed I/O's: -2.1 L x 24 hours and -28.6 L since 03/24/20  Drain output: 4.5 L x 24 hours  Case discussed with RNCM; plan to d/c home today after swallow study. MD has requested that pt transition to 18 hour feeds. Discussed recommendations with RNCM.   Pt has had some vomiting over the weekend. No evidenced of tube feeding in g-tube bag. Suspect vomiting is from PO intake. Pt has been refusing Boost Breeze supplements. Noted meal completion 0% per doc flowsheets.   Labs reviewed: CBGS: 110-129.   Diet Order:   Diet Order            Diet - low sodium heart healthy        Diet clear liquid Room service appropriate? Yes; Fluid consistency: Thin  Diet effective now              EDUCATION NEEDS:   No education needs have been identified at this time  Skin:  Skin Assessment: Skin Integrity Issues: Skin Integrity Issues::  Incisions Incisions: closed abdomen  Last BM:  04/05/20  Height:   Ht Readings from Last 1 Encounters:  03/20/20 6' 1.5" (1.867 m)    Weight:   Wt Readings from Last 1 Encounters:  04/07/20 85.2 kg    Ideal Body Weight:  86.3 kg  BMI:  Body mass index is 24.45 kg/m.  Estimated Nutritional Needs:   Kcal:  227371-0626Protein:  105-120 grams  Fluid:  > 2.2 L    JeLoistine ChanceRD, LDN, CDRosemontegistered Dietitian II Certified Diabetes Care and Education Specialist Please refer to [facility]or RD and/or RD on-call/weekend/after hours pager

## 2020-04-07 NOTE — TOC Progression Note (Signed)
Transition of Care [facility]) - Progression Note    Patient Details  Name: [facility] MRN: [id number] Date of Birth: [birth year]  Transition of Care [facility]) CM/SW Contact  Jacalyn Lefevre Edson Snowball, RN Phone Number: 04/07/2020, 10:21 AM  Clinical Narrative:     Patient to discharge today after swallow study . Dr Barry Dienes recommending Osmolite 80cc/hr for 18 hours a day and off between 0900 to 1500. Jenifer RD aware and will enter note.   Pam with [facility] aware and Butch Penny with [place] aware. Patient will have a [place] visit today.  Expected Discharge Plan: [place] Barriers to Discharge: Continued Medical Work up  Expected Discharge Plan and Services Expected Discharge Plan: [place]   Discharge Planning Services: CM Consult Post Acute Care Choice: [place] arrangements for the past 2 months: Single Family Home Expected Discharge Date: 04/07/20                         HH Arranged: PT, RN           Social Determinants of Health (SDOH) Interventions    Readmission Risk Interventions No flowsheet data found.

## 2020-04-10 ENCOUNTER — Ambulatory Visit (HOSPITAL_COMMUNITY): Admit: 2020-04-10 | Payer: Medicare Other | Admitting: Cardiovascular Disease

## 2020-04-10 ENCOUNTER — Emergency Department (HOSPITAL_COMMUNITY)
Admission: EM | Admit: 2020-04-10 | Discharge: 2020-04-22 | Disposition: E | Payer: Medicare Other | Attending: Emergency Medicine | Admitting: Emergency Medicine

## 2020-04-10 DIAGNOSIS — I213 ST elevation (STEMI) myocardial infarction of unspecified site: Secondary | ICD-10-CM | POA: Insufficient documentation

## 2020-04-10 DIAGNOSIS — I469 Cardiac arrest, cause unspecified: Secondary | ICD-10-CM | POA: Diagnosis not present

## 2020-04-10 SURGERY — CORONARY/GRAFT ACUTE MI REVASCULARIZATION
Anesthesia: LOCAL

## 2020-04-10 MED ORDER — SODIUM CHLORIDE 0.9 % IV BOLUS: 1000.0000 mL | Freq: Once | INTRAVENOUS | Status: DC

## 2020-04-10 MED ORDER — SODIUM CHLORIDE 0.9 % IV SOLN
INTRAVENOUS | Status: AC | PRN
  Administered 2020-04-10: 1000 mL via INTRAVENOUS

## 2020-04-10 MED ORDER — ATROPINE SULFATE 1 MG/ML IJ SOLN
1.0000 mg | Freq: Once | INTRAMUSCULAR | Status: AC
  Administered 2020-04-10: 1 mg via INTRAVENOUS

## 2020-04-18 NOTE — Anesthesia Post-op Follow-up Note (Signed)
  Anesthesia Pain Follow-up Note  Patient: Albert Hartman  Day #: 2  Date of Follow-up: 04/18/2020 Time: 7:58 AM  Last Vitals:  Vitals:   04/07/20 1300 04/07/20 1422  BP: (!) 137/97 (!) 137/97  Pulse: (!) 107 (!) 107  Resp: 18 18  Temp: 36.5 C 36.5 C  SpO2: 100% 100%    Level of Consciousness: alert  Pain: mild   Side Effects:None  Catheter Site Exam:clean     Plan: Continue current therapy of postop epidural at surgeon's request  Julieana Eshleman

## 2020-04-22 NOTE — ED Provider Notes (Signed)
[place] EMERGENCY DEPARTMENT Provider Note   CSN: [id number] Arrival date & time: April 30, 2020  1505     History Chief Complaint  Patient presents with  . Code STEMI    Kaile Kyger is a 74 y.o. male.  HPI Patient is a 74 year old male with recent medical history of duodenal resection 3 weeks ago, subsequently discharged yesterday.  At home, he was having periods of unresponsiveness.  Family described as staring off into space.  EMS was called.  Patient denied chest pains.  EMS twelve-lead EKG showed concerns for inferior ST elevations.  Code STEMI was called.  Upon arrival, patient was awake but unable to answer questions.     Past Medical History:  Diagnosis Date  . GERD (gastroesophageal reflux disease)   . History of kidney stones   . Hypertension   . Sleep apnea     Patient Active Problem List   Diagnosis Date Noted  . Protein-calorie malnutrition, severe 03/27/2020  . Duodenal mass 03/20/2020  . Duodenal cancer ([place]) 03/20/2020    Past Surgical History:  Procedure Laterality Date  . APPENDECTOMY    . BIOPSY  01/24/2020   Procedure: BIOPSY;  Surgeon: Wonda Horner, MD;  Location: [facility];  Service: Endoscopy;;  . BOWEL RESECTION N/A 03/20/2020   Procedure: Resection of Third and Fourth Portions of Duodenum;  Surgeon: Stark Klein, MD;  Location: [facility];  Service: General;  Laterality: N/A;  . CHOLECYSTECTOMY    . ENTEROSCOPY N/A 01/24/2020   Procedure: ENTEROSCOPY WITH LONG ENTEROSCOPE;  Surgeon: Wonda Horner, MD;  Location: [facility];  Service: Endoscopy;  Laterality: N/A;  . EYE SURGERY    . GASTROJEJUNOSTOMY N/A 03/20/2020   Procedure: Gastrojejunostomy;  Surgeon: Stark Klein, MD;  Location: [facility];  Service: General;  Laterality: N/A;  . GASTROSTOMY N/A 03/20/2020   Procedure: Insertion Of Gastrojejunostomy Tube;  Surgeon: Stark Klein, MD;  Location: [facility];  Service: General;  Laterality: N/A;  . IR [place] TUBE CHANGE  04/01/2020   . LAPAROSCOPY N/A 03/20/2020   Procedure: LAPAROSCOPY DIAGNOSTIC;  Surgeon: Stark Klein, MD;  Location: [place];  Service: General;  Laterality: N/A;  . PYLOROPLASTY N/A 03/20/2020   Procedure: Pyloroplasty;  Surgeon: Stark Klein, MD;  Location: [facility];  Service: General;  Laterality: N/A;  . SUBMUCOSAL TATTOO INJECTION  01/24/2020   Procedure: SUBMUCOSAL TATTOO INJECTION;  Surgeon: Wonda Horner, MD;  Location: [facility];  Service: Endoscopy;;  . TONSILLECTOMY         No family history on file.  Social History   Tobacco Use  . Smoking status: Never Smoker  . Smokeless tobacco: Never Used  Substance Use Topics  . Alcohol use: Not Currently  . Drug use: Never    Home Medications Prior to Admission medications   Medication Sig Start Date End Date Taking? Authorizing Provider  allopurinol (ZYLOPRIM) 300 MG tablet Take 300 mg by mouth daily. 12/06/19   [provider]  chlorproMAZINE (THORAZINE) 10 MG tablet Take 1 tablet (10 mg total) by mouth 3 (three) times daily as needed for hiccoughs. 04/07/20   Meuth, Brooke A, PA-C  fluocinonide (LIDEX) 0.05 % external solution Apply 1 application topically 2 (two) times a week. Applied to scalp twice weekly after washing hair. 11/07/19   [provider]  hydrocortisone 2.5 % cream Apply 1 application topically 2 (two) times a week. Sundays & Saturdays. 01/15/20   [provider]  ketoconazole (NIZORAL) 2 % cream Apply 1  application topically every Monday, Tuesday, Wednesday, Thursday, and Friday. Applied to face 11/19/19   [provider]  lisinopril (ZESTRIL) 10 MG tablet Take 10 mg by mouth at bedtime. 01/30/20   [provider]  Multiple Vitamin (MULTIVITAMIN WITH MINERALS) TABS tablet Take 1 tablet by mouth daily. Centrum Silver for Men 50+    [provider]  Nutritional Supplements (FEEDING SUPPLEMENT, OSMOLITE 1.5 CAL,) LIQD Place 1,500 mLs into feeding tube continuous. 04/07/20    Stark Klein, MD  nystatin cream (MYCOSTATIN) Apply 1 application topically 2 (two) times daily. Applied to groin rash 02/26/20   [provider]  omeprazole (PRILOSEC) 40 MG capsule Take 40 mg by mouth in the morning and at bedtime. In the morning & 1630 11/19/19   [provider]  oxyCODONE (ROXICODONE) 5 MG/5ML solution Place 5 mLs (5 mg total) into feeding tube every 6 (six) hours as needed for moderate pain. 04/07/20   Stark Klein, MD  Polyethyl Glycol-Propyl Glycol (LUBRICANT EYE DROPS) 0.4-0.3 % SOLN Place 1 drop into both eyes in the morning and at bedtime.    [provider]  prochlorperazine (COMPAZINE) 10 MG tablet Take 1 tablet (10 mg total) by mouth every 6 (six) hours as needed for nausea or vomiting (Use for nausea and / or vomiting unresolved with ondansetron (Zofran).). 04/07/20   Stark Klein, MD    Allergies    Penicillins and Sulfa antibiotics  Review of Systems   Review of Systems  Unable to perform ROS: Mental status change    Physical Exam Updated Vital Signs Ht 6\' 1"  (1.854 m)   Wt 90.7 kg   BMI 26.39 kg/m   Physical Exam Vitals and nursing note reviewed.  Constitutional:      General: He is in acute distress.     Appearance: He is well-developed. He is ill-appearing.  HENT:     Head: Normocephalic and atraumatic.     Right Ear: External ear normal.     Left Ear: External ear normal.     Nose: Nose normal.  Eyes:     Conjunctiva/sclera: Conjunctivae normal.  Cardiovascular:     Rate and Rhythm: Normal rate and regular rhythm.  Pulmonary:     Effort: No respiratory distress.     Comments: On supplemental oxygen via NRB. Abdominal:     Palpations: Abdomen is soft.     Comments: Two percutaneous gastrostomy tubes present.  Musculoskeletal:        General: No deformity.     Cervical back: Neck supple.  Skin:    Coloration: Skin is pale.  Neurological:     Mental Status: He is disoriented.     ED Results / Procedures /  Treatments   Labs (all labs ordered are listed, but only abnormal results are displayed) Labs Reviewed  SARS CORONAVIRUS 2 BY RT PCR ([facility])  TROPONIN I (HIGH SENSITIVITY)  TROPONIN I (HIGH SENSITIVITY)    EKG None  Radiology No results found.  Procedures Procedures (including critical care time)  Medications Ordered in ED Medications  atropine injection 1 mg (1 mg Intravenous Given 05-05-20 1515)  0.9 %  sodium chloride infusion ( Intravenous Stopped 05-May-2020 2105)    ED Course  I have reviewed the triage vital signs and the nursing notes.  Pertinent labs & imaging results that were available during my care of the patient were reviewed by me and considered in my medical decision making (see chart for details).  MDM Rules/Calculators/A&P                      Patient presented after episodes of unresponsiveness at home.  Upon arrival in the ED, he was awake but minimally responsive.  Not able to answer questions.  Twelve-lead EKG, acquired by EMS showed concerns of inferior lead ST elevations.  Patient arrived as a code STEMI.  Shortly after being placed in ED room, patient had a large volume of brown emesis (approximately 1 L).  He began to have infrequent breathing.  NRB was removed and patient was assisted with bag-valve-mask ventilation.  Patient became bradycardic with heart rate down to the 30s.  1 mg of atropine was given.  Family was contacted to update them of his worsening clinical condition.  While on the phone with family, patient lost pulses.  Family confirmed that patient is DNR/DNI.  Rhythm was PEA.  Bedside ultrasound showed faint quivering of cardiac muscle.  Patient expired at 1521.  Final Clinical Impression(s) / ED Diagnoses Final diagnoses:  Cardiac arrest [facility])  Acute respiratory distress  Death    Rx / DC Orders ED Discharge Orders    None       Godfrey Pick, MD 04/11/20 0515    Rex Kras Wenda Overland, MD 04/12/20 1113

## 2020-04-22 NOTE — Progress Notes (Signed)
Responded to code stemi to support patient and staff.  Pt. Passed away.  Family on way to Hospital. Will continue support to family as needed.    Jaclynn Major, [place], [facility], Pager [phone]

## 2020-04-22 NOTE — Code Documentation (Signed)
Patient time of death occurred at 29 by Dr. Rex Kras.

## 2020-04-22 NOTE — Progress Notes (Addendum)
74 y/o man with duodenal mass, s/p recent partial resection of the duodenum, pathology showing lymphoma. Now presented with chest pain, inferior STEMI, shock. He had a bloody vomiting episode soon after arrival. Spoke with wife and sister over the phone. Poor prognosis and no chance of survival. Patient has a living will that asks no life prolonging measures if he has no chance, per the family. Recommend no cardiac catheterization.  Nigel Mormon, MD [facility]. [place] Pager: [phone] Office: [phone]

## 2020-04-22 NOTE — Code Documentation (Signed)
Pt began vomiting about entering ED exam room, assisted to side and suctioned, >1000cc output of emesis. Lost consciousness at this time.

## 2020-04-22 NOTE — Code Documentation (Signed)
Pulses lost. 

## 2020-04-22 NOTE — ED Triage Notes (Signed)
Pt arrives via [facility] as a code stemi- Stemi MD met pt at bridge. Upon assessment and learning pt was a DNR decision was made to take into ED room and call family. Upon entering ED room pt arrives semi responsive- eyes open and responsive to voice but has intelligible speech. Pt pale and ill appearing. Pt began to projectile vomit- pt turned to side two suctions used- large amount of brown colored emesis and over 1,000 cc in suction container as well as the bed and floor. Pt was then unresponsive GCS of 3 and required assisted ventilations via BVM due to respirtory arrest and became bradycardic with a pulses of 30. After 1 mg atropine pt lost pulses and at this time MD Little had spoken to family who confirm DNR & DNI- Pt allowed to pass peacefully. MD confirmed no cardiac activity via [place] and called TOD 1521.

## 2020-04-22 NOTE — Code Documentation (Signed)
Pt no longer breathing spontaneously, bag valve mask in use

## 2020-04-22 DEATH — deceased
